# Patient Record
Sex: Male | Born: 1960
Health system: Southern US, Community
[De-identification: ages and names within clinical notes are randomized; demographics above are authoritative.]

## PROBLEM LIST (undated history)

## (undated) DIAGNOSIS — Z86010 Personal history of colonic polyps: Secondary | ICD-10-CM

## (undated) DIAGNOSIS — R011 Cardiac murmur, unspecified: Secondary | ICD-10-CM

## (undated) DIAGNOSIS — J45909 Unspecified asthma, uncomplicated: Secondary | ICD-10-CM

## (undated) DIAGNOSIS — J449 Chronic obstructive pulmonary disease, unspecified: Secondary | ICD-10-CM

## (undated) DIAGNOSIS — C801 Malignant (primary) neoplasm, unspecified: Secondary | ICD-10-CM

## (undated) HISTORY — DX: Chronic obstructive pulmonary disease, unspecified: J44.9

## (undated) HISTORY — PX: MOHS SURGERY: SUR867

## (undated) HISTORY — PX: NOSE SURGERY: SHX723

## (undated) HISTORY — PX: HERNIA REPAIR: SHX51

## (undated) HISTORY — DX: Cardiac murmur, unspecified: R01.1

## (undated) HISTORY — DX: Personal history of colonic polyps: Z86.010

## (undated) HISTORY — DX: Unspecified asthma, uncomplicated: J45.909

## (undated) HISTORY — DX: Malignant (primary) neoplasm, unspecified: C80.1

---

## 2000-08-01 ENCOUNTER — Observation Stay (HOSPITAL_COMMUNITY): Admission: RE | Admit: 2000-08-01 | Discharge: 2000-08-02 | Payer: Self-pay | Admitting: Internal Medicine

## 2000-08-01 ENCOUNTER — Encounter: Payer: Self-pay | Admitting: Internal Medicine

## 2002-02-28 HISTORY — PX: COLONOSCOPY: SHX174

## 2007-07-21 ENCOUNTER — Emergency Department (HOSPITAL_COMMUNITY): Admission: EM | Admit: 2007-07-21 | Discharge: 2007-07-21 | Payer: Self-pay | Admitting: Family Medicine

## 2007-08-29 ENCOUNTER — Ambulatory Visit: Payer: Self-pay | Admitting: Internal Medicine

## 2007-08-29 DIAGNOSIS — S6990XA Unspecified injury of unspecified wrist, hand and finger(s), initial encounter: Secondary | ICD-10-CM | POA: Insufficient documentation

## 2007-08-29 DIAGNOSIS — L255 Unspecified contact dermatitis due to plants, except food: Secondary | ICD-10-CM | POA: Insufficient documentation

## 2007-08-29 DIAGNOSIS — Z87448 Personal history of other diseases of urinary system: Secondary | ICD-10-CM | POA: Insufficient documentation

## 2007-08-29 DIAGNOSIS — F329 Major depressive disorder, single episode, unspecified: Secondary | ICD-10-CM | POA: Insufficient documentation

## 2007-08-29 DIAGNOSIS — F3289 Other specified depressive episodes: Secondary | ICD-10-CM | POA: Insufficient documentation

## 2007-08-29 DIAGNOSIS — S59919A Unspecified injury of unspecified forearm, initial encounter: Secondary | ICD-10-CM

## 2007-08-29 DIAGNOSIS — S59909A Unspecified injury of unspecified elbow, initial encounter: Secondary | ICD-10-CM | POA: Insufficient documentation

## 2007-09-04 ENCOUNTER — Encounter (INDEPENDENT_AMBULATORY_CARE_PROVIDER_SITE_OTHER): Payer: Self-pay | Admitting: *Deleted

## 2007-09-04 LAB — CONVERTED CEMR LAB
ALT: 26 units/L (ref 0–53)
AST: 24 units/L (ref 0–37)
Albumin: 4.3 g/dL (ref 3.5–5.2)
Alkaline Phosphatase: 47 units/L (ref 39–117)
BUN: 11 mg/dL (ref 6–23)
Basophils Absolute: 0 10*3/uL (ref 0.0–0.1)
Basophils Relative: 0.7 % (ref 0.0–1.0)
Bilirubin, Direct: 0.1 mg/dL (ref 0.0–0.3)
CO2: 32 meq/L (ref 19–32)
Calcium: 9.4 mg/dL (ref 8.4–10.5)
Chloride: 102 meq/L (ref 96–112)
Cholesterol: 191 mg/dL (ref 0–200)
Creatinine, Ser: 0.9 mg/dL (ref 0.4–1.5)
Eosinophils Absolute: 0.2 10*3/uL (ref 0.0–0.7)
Eosinophils Relative: 4 % (ref 0.0–5.0)
GFR calc Af Amer: 116 mL/min
GFR calc non Af Amer: 96 mL/min
Glucose, Bld: 94 mg/dL (ref 70–99)
HCT: 45.5 % (ref 39.0–52.0)
HDL: 68.7 mg/dL (ref >39.0)
Hemoglobin: 15.5 g/dL (ref 13.0–17.0)
LDL Cholesterol: 114 mg/dL — ABNORMAL HIGH (ref 0–99)
Lymphocytes Relative: 28.1 % (ref 12.0–46.0)
MCHC: 34.1 g/dL (ref 30.0–36.0)
MCV: 91.3 fL (ref 78.0–100.0)
Monocytes Absolute: 0.3 10*3/uL (ref 0.1–1.0)
Monocytes Relative: 4.5 % (ref 3.0–12.0)
Neutro Abs: 4 10*3/uL (ref 1.4–7.7)
Neutrophils Relative %: 62.7 % (ref 43.0–77.0)
PSA: 0.44 ng/mL (ref 0.10–4.00)
Platelets: 229 10*3/uL (ref 150–400)
Potassium: 4.7 meq/L (ref 3.5–5.1)
RBC: 4.98 M/uL (ref 4.22–5.81)
RDW: 12.3 % (ref 11.5–14.6)
Sodium: 140 meq/L (ref 135–145)
TSH: 1.93 microintl units/mL (ref 0.35–5.50)
Total Bilirubin: 0.7 mg/dL (ref 0.3–1.2)
Total CHOL/HDL Ratio: 2.8
Total Protein: 6.5 g/dL (ref 6.0–8.3)
Triglycerides: 42 mg/dL (ref 0–149)
VLDL: 8 mg/dL (ref 0–40)
WBC: 6.2 10*3/uL (ref 4.5–10.5)

## 2008-01-02 ENCOUNTER — Ambulatory Visit: Payer: Self-pay | Admitting: Family Medicine

## 2010-04-24 ENCOUNTER — Emergency Department (HOSPITAL_COMMUNITY)
Admission: EM | Admit: 2010-04-24 | Discharge: 2010-04-24 | Disposition: A | Discharge status: Home / Self Care | Payer: Self-pay | Attending: Emergency Medicine | Admitting: Emergency Medicine

## 2010-04-24 DIAGNOSIS — S01119A Laceration without foreign body of unspecified eyelid and periocular area, initial encounter: Secondary | ICD-10-CM | POA: Insufficient documentation

## 2010-04-24 DIAGNOSIS — IMO0002 Reserved for concepts with insufficient information to code with codable children: Secondary | ICD-10-CM | POA: Insufficient documentation

## 2010-04-24 DIAGNOSIS — Z79899 Other long term (current) drug therapy: Secondary | ICD-10-CM | POA: Insufficient documentation

## 2010-04-24 DIAGNOSIS — W010XXA Fall on same level from slipping, tripping and stumbling without subsequent striking against object, initial encounter: Secondary | ICD-10-CM | POA: Insufficient documentation

## 2010-04-24 DIAGNOSIS — S0003XA Contusion of scalp, initial encounter: Secondary | ICD-10-CM | POA: Insufficient documentation

## 2010-04-24 DIAGNOSIS — F341 Dysthymic disorder: Secondary | ICD-10-CM | POA: Insufficient documentation

## 2010-04-24 DIAGNOSIS — Y990 Civilian activity done for income or pay: Secondary | ICD-10-CM | POA: Insufficient documentation

## 2010-04-24 DIAGNOSIS — H5710 Ocular pain, unspecified eye: Secondary | ICD-10-CM | POA: Insufficient documentation

## 2010-07-16 NOTE — H&P (Signed)
[facility]  Patient:    Roberto Jenkins, Roberto Jenkins                          MRN: [id number] Adm. Date:  08/01/00 Attending:  Claretta Fraise, M.D.                         History and Physical  NOTE:  Medical record number from the clinic is [phone].  Patient is being admitted from the [facility] primary care clinic.  HISTORY OF PRESENT ILLNESS:  This is a 50 year old male patient of Dr. Alwyn Ren, who is here with a chief complaint of chest tightness x 2 days.  Patient notes that his chest tightness happened while he was at work, both on exertion and at rest.  Patient is not a very good historian and is quite vague in trying to get some of his history out of him.  In any case, he does report the tightness feeling.  He does not have any associated symptoms of shortness of breath or diaphoresis, but he does note that the chest tightness has been on and off over the past two days.  He does report an increased amount of stress, primarily at work and both at home, and he does note that a lot of his chest tightness may worsen as he is driving home.  This has been the case over the past two weeks that he apparently has been having this chest tightness on and off.  He says that relaxing and not having any other stimulation makes him feel better.  Patient has had a mild cough but has not heard himself wheezing. There is no previous history of asthma.  Some of his cough has been mostly dry.  He does have some nasal stuffiness and for the past few days, he has been using his steroid nasal spray and also an antihistamine nasal spray.  In regard to his other associated symptoms, he denies any paroxysmal nocturnal dyspnea, orthopnea, or lower extremity edema.  In regard to his cardiac risk factors, he is a smoker of about a pack per day for the past 20 years.  He does not know his family history, since he is an adopted child.  There is no known history of  hypertension or diabetes.  His last cholesterol check was done approximately four years ago, and he says it was apparently normal and has always been normal.  He is not obese.  He is fairly slender, actually.  ALLERGIES:  No known drug allergies.  MEDICATIONS:  He is on the Nasonex and also the Afrin nasal spray.  He has not been taking any decongestants.  PAST MEDICAL HISTORY:  Significant only for allergic rhinitis.  PAST SURGICAL HISTORY:  He has had a nose fracture repair done when he was a child and also bilateral inguinal herniorrhaphies as a child.  FAMILY HISTORY:  Noncontributory, since he is adopted.  SOCIAL HISTORY:  He is married.  He does smoke.  REVIEW OF SYSTEMS:  As per HPI.  He otherwise denies any melena, hematochezia. No urinary symptoms.  He does report heartburn over the past month that was initially q.d. but since the use of over-the-counter Zantac, it has lessened to where he may only get it episodically.  PHYSICAL EXAMINATION:  VITAL SIGNS:  His blood pressure is 108/78, respiratory rate 18, pulse of 61. He is afebrile.  Weight of 169 pounds.  GENERAL:  He is in no apparent distress.  He is a fairly lean gentleman.  HEENT:  Fairly unremarkable.  His TMs are unremarkable.  Oropharynx is without any erythema or exudate, but he does have some postnasal drainage.  Nose: Normal mucosa.  NECK:  No adenopathy.  Carotid pulses are 2+ bilaterally.  CHEST:  Lungs are clear to auscultation bilaterally without any crackles or wheezes.  CARDIAC:  Regular rate and rhythm.  There is no murmur auscultated.  His radial pulses are 2+, and he has no pretibial or presacral edema.  ABDOMEN:  Bowel sounds are normal.  Soft, nontender.  No organomegaly.  No masses are palpated.  NEUROLOGIC:  Grossly normal.  LABORATORY DATA:  A 12-lead EKG was done, which showed normal sinus rhythm. He had some early repolarization signs in V2 and V3 but otherwise no ST-T  wave changes or elevations.  There is no suggestion of old MI on the EKG, either.  ASSESSMENT AND PLAN: 1. Chest pain.  The nature of the chest pain seems atypical, but the pattern    of the chest pain and the lack of associated symptoms goes against cardiac    in source, and also the fact that it has been constant on and off kind of    goes against cardiac also.  There is a strong suggestion of anxiety causing    a major portion of his symptoms.  However, in light of him being a male in    his age group and him being a smoker, will go ahead and admit him for rule    out myocardial infarction with serial CKs and troponin.  Will just do    sublingual nitroglycerin.  The beta blocker is being held currently, since    his heart rate is already 61.  Will go ahead and give him aspirin once a    day and set him up for lipid profile in the morning. 2. In regard to his anxiety, which I believe is a major cause of his chest    tightness, will go ahead and start him on Prozac 10 mg p.o. q.d. x 1 week,    and then he will be increased to 20 mg p.o. q.d. 3. In regard to some of his heartburn symptoms, will go ahead and do the    Protonix 40 mg p.o. q.d. DD:  08/01/00 TD:  08/02/00 Job: [id number]

## 2010-07-16 NOTE — Discharge Summary (Signed)
[facility]  Patient:    Roberto Jenkins, Roberto Jenkins                        MRN: [id number] Adm. Date:  [id number] Disc. Date: [id number] Attending:  Dorena Cookey CC:         Titus Dubin. Alwyn Ren, M.D. [facility]   Discharge Summary  ADMISSION DIAGNOSIS:  Chest pain.  DISCHARGE DIAGNOSIS:  Chest pain, myocardial infarction ruled out.  HISTORY OF PRESENT ILLNESS:  The patient is a 50 year old Caucasian gentleman who presented to the [facility] office and was seen by Dr. Baldo Daub on the day of admission.  He had had a two-month history of chest pain.  This was atypical discomfort with no orthopnea, no diaphoresis, nonexertional.  The patient has a relatively low cardiac risk profile with positive ______ smoking history.  He has been under a significant amount of stress.  Please see Dr. Yetta Numbers note for past medical history, family history, and exam.  HOSPITAL COURSE:  The patient was admitted to a telemetry unit.  Serial cardiac enzymes were obtained, which returned as CK of 45 with troponin of 0.01, CK of 49, no MB fraction.  Troponin is 0.01.  On telemetry he had a stable rhythm.  Additional laboratories reveal a normal CBC and normal basic metabolic panel.  Chest x-ray revealed no active disease except for possible early bronchitis.  With the patients cardiac enzymes being stable, with him having no significant ______ chest pain, he is felt to be stable and ready for discharge.  DISPOSITION:  The patient is discharged home.  DISCHARGE INSTRUCTIONS:  He will need to be scheduled for a stress Cardiolite study, which can be arranged through Dr. Caryl Never office.  I have recommended that he talk with Dr. Alwyn Ren about arranging for some treatments of stress management.  The patient has already been started on Prozac, which he will continue.  The patient does have a history of reflux and will continue on Protonix.  The patient is advised to stop  smoking.  CONDITION ON DISCHARGE:  Stable, with MI ruled out. DD:  08/02/00 TD:  08/02/00 Job: [id number]

## 2011-09-21 ENCOUNTER — Encounter (HOSPITAL_COMMUNITY): Payer: Self-pay

## 2011-09-21 ENCOUNTER — Emergency Department (INDEPENDENT_AMBULATORY_CARE_PROVIDER_SITE_OTHER)
Admission: EM | Admit: 2011-09-21 | Discharge: 2011-09-21 | Disposition: A | Discharge status: Home / Self Care | Payer: Self-pay | Source: Home / Self Care

## 2011-09-21 DIAGNOSIS — J029 Acute pharyngitis, unspecified: Secondary | ICD-10-CM

## 2011-09-21 DIAGNOSIS — R059 Cough, unspecified: Secondary | ICD-10-CM

## 2011-09-21 DIAGNOSIS — R05 Cough: Secondary | ICD-10-CM

## 2011-09-21 DIAGNOSIS — J3489 Other specified disorders of nose and nasal sinuses: Secondary | ICD-10-CM

## 2011-09-21 MED ORDER — LORATADINE 10 MG PO TABS: 10.0000 mg | ORAL_TABLET | Freq: Every day | ORAL | Status: DC

## 2011-09-21 MED ORDER — ALBUTEROL SULFATE HFA 108 (90 BASE) MCG/ACT IN AERS: 2.0000 | INHALATION_SPRAY | Freq: Four times a day (QID) | RESPIRATORY_TRACT | Status: DC | PRN

## 2011-09-21 NOTE — ED Provider Notes (Signed)
History     CSN: [id number]  Arrival date & time 09/21/11  1521   None     Chief Complaint  Patient presents with  . Sore Throat    (Consider location/radiation/quality/duration/timing/severity/associated sxs/prior treatment) Patient is a 51 y.o. male presenting with pharyngitis. The history is provided by the patient.  Sore Throat  Roberto Jenkins is a 51 y.o. male who complains of onset of sore throat for 10 days.  Scratchy sore throat + cough, non productive No pleuritic pain No wheezing + nasal congestion + post-nasal drainage No sinus pain/pressure No laryngitis No chest congestion No itchy/red eyes No earache No hemoptysis No SOB No chills/sweats No fever No nausea No vomiting No abdominal pain No diarrhea No skin rashes No fatigue + myalgias No headache  No ill contacts   History reviewed. No pertinent past medical history.  Past Surgical History  Procedure Date  . Hernia repair     No family history on file.  History  Substance Use Topics  . Smoking status: Current Everyday Smoker -- 1.0 packs/day  . Smokeless tobacco: Not on file  . Alcohol Use: Yes      Review of Systems  All other systems reviewed and are negative.    Allergies  Review of patient's allergies indicates no known allergies.  Home Medications   Current Outpatient Rx  Name Route Sig Dispense Refill  . ALBUTEROL SULFATE HFA 108 (90 BASE) MCG/ACT IN AERS Inhalation Inhale 2 puffs into the lungs every 6 (six) hours as needed for wheezing or shortness of breath. 1 Inhaler 2  . LORATADINE 10 MG PO TABS Oral Take 1 tablet (10 mg total) by mouth daily. 30 tablet 0    BP 138/88  Pulse 85  Temp 98.6 F (37 C) (Oral)  Resp 16  SpO2 98%  Physical Exam  Nursing note and vitals reviewed. Constitutional: He is oriented to person, place, and time. Vital signs are normal. He appears well-developed and well-nourished. He is active and cooperative.  HENT:  Head: Normocephalic.    Right Ear: Hearing, tympanic membrane, external ear and ear canal normal.  Left Ear: Hearing, tympanic membrane, external ear and ear canal normal.  Nose: Rhinorrhea present. No mucosal edema.  Mouth/Throat: Uvula is midline, oropharynx is clear and moist and mucous membranes are normal.       Postnasal drip noted  Eyes: Conjunctivae are normal. Pupils are equal, round, and reactive to light. No scleral icterus.  Neck: Trachea normal. Neck supple.  Cardiovascular: Normal rate and regular rhythm.   Pulmonary/Chest: Effort normal and breath sounds normal.  Lymphadenopathy:       Head (right side): No submental, no submandibular, no tonsillar, no preauricular, no posterior auricular and no occipital adenopathy present.       Head (left side): No submental, no submandibular, no tonsillar, no preauricular, no posterior auricular and no occipital adenopathy present.    He has no cervical adenopathy.  Neurological: He is alert and oriented to person, place, and time. No cranial nerve deficit or sensory deficit.  Skin: Skin is warm and dry.  Psychiatric: He has a normal mood and affect. His speech is normal and behavior is normal. Judgment and thought content normal. Cognition and memory are normal.    ED Course  Procedures (including critical care time)   Labs Reviewed  POCT RAPID STREP A ([facility] CARE ONLY)   No results found.   1. Pharyngitis   2. Rhinorrhea   3. Cough  MDM  Increase fluid intake, rest.  No antibiotics indicated.  Begin expectorant/decongestant, topical decongestant, saline nasal spray and/or saline irrigation, and cough suppressant at bedtime. Antihistamines of your choice (Claritin or Zyrtec).  Tylenol or Motrin for fever/discomfort.  Followup with PCP if not improving 7 to 10 days.        Roberto Kindred, NP 09/21/11 2112

## 2011-09-21 NOTE — ED Notes (Signed)
C/o sore throat, stuffy nose, thick clear nasal secretions, fatigue and increased coughing.  Sx for 10 days.  States he feels like he has had fever.

## 2011-09-22 NOTE — ED Provider Notes (Signed)
Medical screening examination/treatment/procedure(s) were performed by non-physician practitioner and as supervising physician I was immediately available for consultation/collaboration.  Leslee Home, M.D.   Reuben Likes, MD 09/22/11 1052

## 2013-02-28 NOTE — ED Notes (Signed)
Pt  called  Asking  For  A refill of  His  Albuterol     Message  Left on his  Answering  Machine

## 2014-09-26 ENCOUNTER — Encounter: Payer: Self-pay | Admitting: Family

## 2014-09-26 ENCOUNTER — Other Ambulatory Visit (INDEPENDENT_AMBULATORY_CARE_PROVIDER_SITE_OTHER): Payer: 59

## 2014-09-26 ENCOUNTER — Ambulatory Visit (INDEPENDENT_AMBULATORY_CARE_PROVIDER_SITE_OTHER): Payer: 59 | Admitting: Family

## 2014-09-26 VITALS — BP 144/100 | HR 74 | Temp 98.2°F | Resp 18 | Ht 71.0 in | Wt 161.4 lb

## 2014-09-26 DIAGNOSIS — Z0000 Encounter for general adult medical examination without abnormal findings: Secondary | ICD-10-CM

## 2014-09-26 DIAGNOSIS — Z9189 Other specified personal risk factors, not elsewhere classified: Secondary | ICD-10-CM

## 2014-09-26 DIAGNOSIS — M545 Low back pain, unspecified: Secondary | ICD-10-CM

## 2014-09-26 DIAGNOSIS — K649 Unspecified hemorrhoids: Secondary | ICD-10-CM | POA: Diagnosis not present

## 2014-09-26 DIAGNOSIS — R21 Rash and other nonspecific skin eruption: Secondary | ICD-10-CM

## 2014-09-26 LAB — BASIC METABOLIC PANEL
BUN: 9 mg/dL (ref 6–23)
CO2: 30 mEq/L (ref 19–32)
Calcium: 9.6 mg/dL (ref 8.4–10.5)
Chloride: 100 mEq/L (ref 96–112)
Creatinine, Ser: 0.78 mg/dL (ref 0.40–1.50)
GFR: 110.02 mL/min (ref >60.00)
Glucose, Bld: 77 mg/dL (ref 70–99)
Potassium: 4.3 mEq/L (ref 3.5–5.1)
Sodium: 139 mEq/L (ref 135–145)

## 2014-09-26 LAB — CBC
HCT: 46.3 % (ref 39.0–52.0)
Hemoglobin: 15.6 g/dL (ref 13.0–17.0)
MCHC: 33.6 g/dL (ref 30.0–36.0)
MCV: 90 fl (ref 78.0–100.0)
Platelets: 218 10*3/uL (ref 150.0–400.0)
RBC: 5.15 Mil/uL (ref 4.22–5.81)
RDW: 13 % (ref 11.5–15.5)
WBC: 6.2 10*3/uL (ref 4.0–10.5)

## 2014-09-26 LAB — LIPID PANEL
CHOL/HDL RATIO: 2
CHOLESTEROL: 214 mg/dL — AB (ref 0–200)
HDL: 100.5 mg/dL (ref >39.00)
LDL Cholesterol: 101 mg/dL — ABNORMAL HIGH (ref 0–99)
NonHDL: 113.62
Triglycerides: 64 mg/dL (ref 0.0–149.0)
VLDL: 12.8 mg/dL (ref 0.0–40.0)

## 2014-09-26 LAB — TSH: TSH: 2.08 u[IU]/mL (ref 0.35–4.50)

## 2014-09-26 LAB — PSA: PSA: 0.16 ng/mL (ref 0.10–4.00)

## 2014-09-26 NOTE — Patient Instructions (Addendum)
Thank you for choosing [facility].  Summary/Instructions:   Please stop by the lab on the basement level of the building for your blood work. Your results will be released to [place] (or called to you) after review, usually within 72 hours after test completion. If any changes need to be made, you will be notified at that same time.  If your symptoms worsen or fail to improve, please contact our office for further instruction, or in case of emergency go directly to the emergency room at the closest medical facility.    Hemorrhoids Hemorrhoids are swollen veins around the rectum or anus. There are two types of hemorrhoids:   Internal hemorrhoids. These occur in the veins just inside the rectum. They may poke through to the outside and become irritated and painful.  External hemorrhoids. These occur in the veins outside the anus and can be felt as a painful swelling or hard lump near the anus. CAUSES  Pregnancy.   Obesity.   Constipation or diarrhea.   Straining to have a bowel movement.   Sitting for long periods on the toilet.  Heavy lifting or other activity that caused you to strain.  Anal intercourse. SYMPTOMS   Pain.   Anal itching or irritation.   Rectal bleeding.   Fecal leakage.   Anal swelling.   One or more lumps around the anus.  DIAGNOSIS  Your caregiver may be able to diagnose hemorrhoids by visual examination. Other examinations or tests that may be performed include:   Examination of the rectal area with a gloved hand (digital rectal exam).   Examination of anal canal using a small tube (scope).   A blood test if you have lost a significant amount of blood.  A test to look inside the colon (sigmoidoscopy or colonoscopy). TREATMENT Most hemorrhoids can be treated at home. However, if symptoms do not seem to be getting better or if you have a lot of rectal bleeding, your caregiver may perform a procedure to help make the  hemorrhoids get smaller or remove them completely. Possible treatments include:   Placing a rubber band at the base of the hemorrhoid to cut off the circulation (rubber band ligation).   Injecting a chemical to shrink the hemorrhoid (sclerotherapy).   Using a tool to burn the hemorrhoid (infrared light therapy).   Surgically removing the hemorrhoid (hemorrhoidectomy).   Stapling the hemorrhoid to block blood flow to the tissue (hemorrhoid stapling).  HOME CARE INSTRUCTIONS   Eat foods with fiber, such as whole grains, beans, nuts, fruits, and vegetables. Ask your doctor about taking products with added fiber in them (fibersupplements).  Increase fluid intake. Drink enough water and fluids to keep your urine clear or pale yellow.   Exercise regularly.   Go to the bathroom when you have the urge to have a bowel movement. Do not wait.   Avoid straining to have bowel movements.   Keep the anal area dry and clean. Use wet toilet paper or moist towelettes after a bowel movement.   Medicated creams and suppositories may be used or applied as directed.   Only take over-the-counter or prescription medicines as directed by your caregiver.   Take warm sitz baths for 15-20 minutes, 3-4 times a day to ease pain and discomfort.   Place ice packs on the hemorrhoids if they are tender and swollen. Using ice packs between sitz baths may be helpful.   Put ice in a plastic bag.   Place a towel between your  skin and the bag.   Leave the ice on for 15-20 minutes, 3-4 times a day.   Do not use a donut-shaped pillow or sit on the toilet for long periods. This increases blood pooling and pain.  SEEK MEDICAL CARE IF:  You have increasing pain and swelling that is not controlled by treatment or medicine.  You have uncontrolled bleeding.  You have difficulty or you are unable to have a bowel movement.  You have pain or inflammation outside the area of the hemorrhoids. MAKE  SURE YOU:  Understand these instructions.  Will watch your condition.  Will get help right away if you are not doing well or get worse. Document Released: 02/12/2000 Document Revised: 02/01/2012 Document Reviewed: 12/20/2011 [facility] Patient Information 2015 [place], [place]. This information is not intended to replace advice given to you by your health care provider. Make sure you discuss any questions you have with your health care provider.  Low Back Sprain with Rehab  A sprain is an injury in which a ligament is torn. The ligaments of the lower back are vulnerable to sprains. However, they are strong and require great force to be injured. These ligaments are important for stabilizing the spinal column. Sprains are classified into three categories. Grade 1 sprains cause pain, but the tendon is not lengthened. Grade 2 sprains include a lengthened ligament, due to the ligament being stretched or partially ruptured. With grade 2 sprains there is still function, although the function may be decreased. Grade 3 sprains involve a complete tear of the tendon or muscle, and function is usually impaired. SYMPTOMS   Severe pain in the lower back.  Sometimes, a feeling of a "pop," "snap," or tear, at the time of injury.  Tenderness and sometimes swelling at the injury site.  Uncommonly, bruising (contusion) within 48 hours of injury.  Muscle spasms in the back. CAUSES  Low back sprains occur when a force is placed on the ligaments that is greater than they can handle. Common causes of injury include:  Performing a stressful act while off-balance.  Repetitive stressful activities that involve movement of the lower back.  Direct hit (trauma) to the lower back. RISK INCREASES WITH:  Contact sports (football, wrestling).  Collisions (major skiing accidents).  Sports that require throwing or lifting (baseball, weightlifting).  Sports involving twisting of the spine (gymnastics, diving,  tennis, golf).  Poor strength and flexibility.  Inadequate protection.  Previous back injury or surgery (especially fusion). PREVENTION  Wear properly fitted and padded protective equipment.  Warm up and stretch properly before activity.  Allow for adequate recovery between workouts.  Maintain physical fitness:  Strength, flexibility, and endurance.  Cardiovascular fitness.  Maintain a healthy body weight. PROGNOSIS  If treated properly, low back sprains usually heal with non-surgical treatment. The length of time for healing depends on the severity of the injury.  RELATED COMPLICATIONS   Recurring symptoms, resulting in a chronic problem.  Chronic inflammation and pain in the low back.  Delayed healing or resolution of symptoms, especially if activity is resumed too soon.  Prolonged impairment.  Unstable or arthritic joints of the low back. TREATMENT  Treatment first involves the use of ice and medicine, to reduce pain and inflammation. The use of strengthening and stretching exercises may help reduce pain with activity. These exercises may be performed at home or with a therapist. Severe injuries may require referral to a therapist for further evaluation and treatment, such as ultrasound. Your caregiver may advise that you  wear a back brace or corset, to help reduce pain and discomfort. Often, prolonged bed rest results in greater harm then benefit. Corticosteroid injections may be recommended. However, these should be reserved for the most serious cases. It is important to avoid using your back when lifting objects. At night, sleep on your back on a firm mattress, with a pillow placed under your knees. If non-surgical treatment is unsuccessful, surgery may be needed.  MEDICATION   If pain medicine is needed, nonsteroidal anti-inflammatory medicines (aspirin and ibuprofen), or other minor pain relievers (acetaminophen), are often advised.  Do not take pain medicine for 7  days before surgery.  Prescription pain relievers may be given, if your caregiver thinks they are needed. Use only as directed and only as much as you need.  Ointments applied to the skin may be helpful.  Corticosteroid injections may be given by your caregiver. These injections should be reserved for the most serious cases, because they may only be given a certain number of times. HEAT AND COLD  Cold treatment (icing) should be applied for 10 to 15 minutes every 2 to 3 hours for inflammation and pain, and immediately after activity that aggravates your symptoms. Use ice packs or an ice massage.  Heat treatment may be used before performing stretching and strengthening activities prescribed by your caregiver, physical therapist, or athletic trainer. Use a heat pack or a warm water soak. SEEK MEDICAL CARE IF:   Symptoms get worse or do not improve in 2 to 4 weeks, despite treatment.  You develop numbness or weakness in either leg.  You lose bowel or bladder function.  Any of the following occur after surgery: fever, increased pain, swelling, redness, drainage of fluids, or bleeding in the affected area.  New, unexplained symptoms develop. (Drugs used in treatment may produce side effects.) EXERCISES  RANGE OF MOTION (ROM) AND STRETCHING EXERCISES - Low Back Sprain Most people with lower back pain will find that their symptoms get worse with excessive bending forward (flexion) or arching at the lower back (extension). The exercises that will help resolve your symptoms will focus on the opposite motion.  Your physician, physical therapist or athletic trainer will help you determine which exercises will be most helpful to resolve your lower back pain. Do not complete any exercises without first consulting with your caregiver. Discontinue any exercises which make your symptoms worse, until you speak to your caregiver. If you have pain, numbness or tingling which travels down into your buttocks,  leg or foot, the goal of the therapy is for these symptoms to move closer to your back and eventually resolve. Sometimes, these leg symptoms will get better, but your lower back pain may worsen. This is often an indication of progress in your rehabilitation. Be very alert to any changes in your symptoms and the activities in which you participated in the 24 hours prior to the change. Sharing this information with your caregiver will allow him or her to most efficiently treat your condition. These exercises may help you when beginning to rehabilitate your injury. Your symptoms may resolve with or without further involvement from your physician, physical therapist or athletic trainer. While completing these exercises, remember:   Restoring tissue flexibility helps normal motion to return to the joints. This allows healthier, less painful movement and activity.  An effective stretch should be held for at least 30 seconds.  A stretch should never be painful. You should only feel a gentle lengthening or release in the stretched  tissue. FLEXION RANGE OF MOTION AND STRETCHING EXERCISES: STRETCH - Flexion, Single Knee to Chest   Lie on a firm bed or floor with both legs extended in front of you.  Keeping one leg in contact with the floor, bring your opposite knee to your chest. Hold your leg in place by either grabbing behind your thigh or at your knee.  Pull until you feel a gentle stretch in your low back. Hold __________ seconds.  Slowly release your grasp and repeat the exercise with the opposite side. Repeat __________ times. Complete this exercise __________ times per day.  STRETCH - Flexion, Double Knee to Chest  Lie on a firm bed or floor with both legs extended in front of you.  Keeping one leg in contact with the floor, bring your opposite knee to your chest.  Tense your stomach muscles to support your back and then lift your other knee to your chest. Hold your legs in place by either  grabbing behind your thighs or at your knees.  Pull both knees toward your chest until you feel a gentle stretch in your low back. Hold __________ seconds.  Tense your stomach muscles and slowly return one leg at a time to the floor. Repeat __________ times. Complete this exercise __________ times per day.  STRETCH - Low Trunk Rotation  Lie on a firm bed or floor. Keeping your legs in front of you, bend your knees so they are both pointed toward the ceiling and your feet are flat on the floor.  Extend your arms out to the side. This will stabilize your upper body by keeping your shoulders in contact with the floor.  Gently and slowly drop both knees together to one side until you feel a gentle stretch in your low back. Hold for __________ seconds.  Tense your stomach muscles to support your lower back as you bring your knees back to the starting position. Repeat the exercise to the other side. Repeat __________ times. Complete this exercise __________ times per day  EXTENSION RANGE OF MOTION AND FLEXIBILITY EXERCISES: STRETCH - Extension, Prone on Elbows   Lie on your stomach on the floor, a bed will be too soft. Place your palms about shoulder width apart and at the height of your head.  Place your elbows under your shoulders. If this is too painful, stack pillows under your chest.  Allow your body to relax so that your hips drop lower and make contact more completely with the floor.  Hold this position for __________ seconds.  Slowly return to lying flat on the floor. Repeat __________ times. Complete this exercise __________ times per day.  RANGE OF MOTION - Extension, Prone Press Ups  Lie on your stomach on the floor, a bed will be too soft. Place your palms about shoulder width apart and at the height of your head.  Keeping your back as relaxed as possible, slowly straighten your elbows while keeping your hips on the floor. You may adjust the placement of your hands to maximize  your comfort. As you gain motion, your hands will come more underneath your shoulders.  Hold this position __________ seconds.  Slowly return to lying flat on the floor. Repeat __________ times. Complete this exercise __________ times per day.  RANGE OF MOTION- Quadruped, Neutral Spine   Assume a hands and knees position on a firm surface. Keep your hands under your shoulders and your knees under your hips. You may place padding under your knees for comfort.  Drop  your head and point your tailbone toward the ground below you. This will round out your lower back like an angry cat. Hold this position for __________ seconds.  Slowly lift your head and release your tail bone so that your back sags into a large arch, like an old horse.  Hold this position for __________ seconds.  Repeat this until you feel limber in your low back.  Now, find your "sweet spot." This will be the most comfortable position somewhere between the two previous positions. This is your neutral spine. Once you have found this position, tense your stomach muscles to support your low back.  Hold this position for __________ seconds. Repeat __________ times. Complete this exercise __________ times per day.  STRENGTHENING EXERCISES - Low Back Sprain These exercises may help you when beginning to rehabilitate your injury. These exercises should be done near your "sweet spot." This is the neutral, low-back arch, somewhere between fully rounded and fully arched, that is your least painful position. When performed in this safe range of motion, these exercises can be used for people who have either a flexion or extension based injury. These exercises may resolve your symptoms with or without further involvement from your physician, physical therapist or athletic trainer. While completing these exercises, remember:   Muscles can gain both the endurance and the strength needed for everyday activities through controlled  exercises.  Complete these exercises as instructed by your physician, physical therapist or athletic trainer. Increase the resistance and repetitions only as guided.  You may experience muscle soreness or fatigue, but the pain or discomfort you are trying to eliminate should never worsen during these exercises. If this pain does worsen, stop and make certain you are following the directions exactly. If the pain is still present after adjustments, discontinue the exercise until you can discuss the trouble with your caregiver. STRENGTHENING - Deep Abdominals, Pelvic Tilt   Lie on a firm bed or floor. Keeping your legs in front of you, bend your knees so they are both pointed toward the ceiling and your feet are flat on the floor.  Tense your lower abdominal muscles to press your low back into the floor. This motion will rotate your pelvis so that your tail bone is scooping upwards rather than pointing at your feet or into the floor. With a gentle tension and even breathing, hold this position for __________ seconds. Repeat __________ times. Complete this exercise __________ times per day.  STRENGTHENING - Abdominals, Crunches   Lie on a firm bed or floor. Keeping your legs in front of you, bend your knees so they are both pointed toward the ceiling and your feet are flat on the floor. Cross your arms over your chest.  Slightly tip your chin down without bending your neck.  Tense your abdominals and slowly lift your trunk high enough to just clear your shoulder blades. Lifting higher can put excessive stress on the lower back and does not further strengthen your abdominal muscles.  Control your return to the starting position. Repeat __________ times. Complete this exercise __________ times per day.  STRENGTHENING - Quadruped, Opposite UE/LE Lift   Assume a hands and knees position on a firm surface. Keep your hands under your shoulders and your knees under your hips. You may place padding under  your knees for comfort.  Find your neutral spine and gently tense your abdominal muscles so that you can maintain this position. Your shoulders and hips should form a rectangle that is parallel  with the floor and is not twisted.  Keeping your trunk steady, lift your right hand no higher than your shoulder and then your left leg no higher than your hip. Make sure you are not holding your breath. Hold this position for __________ seconds.  Continuing to keep your abdominal muscles tense and your back steady, slowly return to your starting position. Repeat with the opposite arm and leg. Repeat __________ times. Complete this exercise __________ times per day.  STRENGTHENING - Abdominals and Quadriceps, Straight Leg Raise   Lie on a firm bed or floor with both legs extended in front of you.  Keeping one leg in contact with the floor, bend the other knee so that your foot can rest flat on the floor.  Find your neutral spine, and tense your abdominal muscles to maintain your spinal position throughout the exercise.  Slowly lift your straight leg off the floor about 6 inches for a count of 15, making sure to not hold your breath.  Still keeping your neutral spine, slowly lower your leg all the way to the floor. Repeat this exercise with each leg __________ times. Complete this exercise __________ times per day. POSTURE AND BODY MECHANICS CONSIDERATIONS - Low Back Sprain Keeping correct posture when sitting, standing or completing your activities will reduce the stress put on different body tissues, allowing injured tissues a chance to heal and limiting painful experiences. The following are general guidelines for improved posture. Your physician or physical therapist will provide you with any instructions specific to your needs. While reading these guidelines, remember:  The exercises prescribed by your provider will help you have the flexibility and strength to maintain correct postures.  The  correct posture provides the best environment for your joints to work. All of your joints have less wear and tear when properly supported by a spine with good posture. This means you will experience a healthier, less painful body.  Correct posture must be practiced with all of your activities, especially prolonged sitting and standing. Correct posture is as important when doing repetitive low-stress activities (typing) as it is when doing a single heavy-load activity (lifting). RESTING POSITIONS Consider which positions are most painful for you when choosing a resting position. If you have pain with flexion-based activities (sitting, bending, stooping, squatting), choose a position that allows you to rest in a less flexed posture. You would want to avoid curling into a fetal position on your side. If your pain worsens with extension-based activities (prolonged standing, working overhead), avoid resting in an extended position such as sleeping on your stomach. Most people will find more comfort when they rest with their spine in a more neutral position, neither too rounded nor too arched. Lying on a non-sagging bed on your side with a pillow between your knees, or on your back with a pillow under your knees will often provide some relief. Keep in mind, being in any one position for a prolonged period of time, no matter how correct your posture, can still lead to stiffness. PROPER SITTING POSTURE In order to minimize stress and discomfort on your spine, you must sit with correct posture. Sitting with good posture should be effortless for a healthy body. Returning to good posture is a gradual process. Many people can work toward this most comfortably by using various supports until they have the flexibility and strength to maintain this posture on their own. When sitting with proper posture, your ears will fall over your shoulders and your shoulders will fall  over your hips. You should use the back of the chair  to support your upper back. Your lower back will be in a neutral position, just slightly arched. You may place a small pillow or folded towel at the base of your lower back for  support.  When working at a desk, create an environment that supports good, upright posture. Without extra support, muscles tire, which leads to excessive strain on joints and other tissues. Keep these recommendations in mind: CHAIR:  A chair should be able to slide under your desk when your back makes contact with the back of the chair. This allows you to work closely.  The chair's height should allow your eyes to be level with the upper part of your monitor and your hands to be slightly lower than your elbows. BODY POSITION  Your feet should make contact with the floor. If this is not possible, use a foot rest.  Keep your ears over your shoulders. This will reduce stress on your neck and low back. INCORRECT SITTING POSTURES  If you are feeling tired and unable to assume a healthy sitting posture, do not slouch or slump. This puts excessive strain on your back tissues, causing more damage and pain. Healthier options include:  Using more support, like a lumbar pillow.  Switching tasks to something that requires you to be upright or walking.  Talking a brief walk.  Lying down to rest in a neutral-spine position. PROLONGED STANDING WHILE SLIGHTLY LEANING FORWARD  When completing a task that requires you to lean forward while standing in one place for a long time, place either foot up on a stationary 2-4 inch high object to help maintain the best posture. When both feet are on the ground, the lower back tends to lose its slight inward curve. If this curve flattens (or becomes too large), then the back and your other joints will experience too much stress, tire more quickly, and can cause pain. CORRECT STANDING POSTURES Proper standing posture should be assumed with all daily activities, even if they only take a few  moments, like when brushing your teeth. As in sitting, your ears should fall over your shoulders and your shoulders should fall over your hips. You should keep a slight tension in your abdominal muscles to brace your spine. Your tailbone should point down to the ground, not behind your body, resulting in an over-extended swayback posture.  INCORRECT STANDING POSTURES  Common incorrect standing postures include a forward head, locked knees and/or an excessive swayback. WALKING Walk with an upright posture. Your ears, shoulders and hips should all line-up. PROLONGED ACTIVITY IN A FLEXED POSITION When completing a task that requires you to bend forward at your waist or lean over a low surface, try to find a way to stabilize 3 out of 4 of your limbs. You can place a hand or elbow on your thigh or rest a knee on the surface you are reaching across. This will provide you more stability, so that your muscles do not tire as quickly. By keeping your knees relaxed, or slightly bent, you will also reduce stress across your lower back. CORRECT LIFTING TECHNIQUES DO :  Assume a wide stance. This will provide you more stability and the opportunity to get as close as possible to the object which you are lifting.  Tense your abdominals to brace your spine. Bend at the knees and hips. Keeping your back locked in a neutral-spine position, lift using your leg muscles. Lift with your  legs, keeping your back straight.  Test the weight of unknown objects before attempting to lift them.  Try to keep your elbows locked down at your sides in order get the best strength from your shoulders when carrying an object.  Always ask for help when lifting heavy or awkward objects. INCORRECT LIFTING TECHNIQUES DO NOT:   Lock your knees when lifting, even if it is a small object.  Bend and twist. Pivot at your feet or move your feet when needing to change directions.  Assume that you can safely pick up even a paperclip  without proper posture. Document Released: 02/14/2005 Document Revised: 05/09/2011 Document Reviewed: 05/29/2008 [facility] Patient Information 2015 [place], [place]. This information is not intended to replace advice given to you by your health care provider. Make sure you discuss any questions you have with your health care provider.

## 2014-09-26 NOTE — Progress Notes (Signed)
Subjective:    Patient ID: Roberto Jenkins, male    DOB: June 19, 1960, 54 y.o.   MRN: [id number]  Chief Complaint  Patient presents with  . Establish Care    States he has been passing blood in stool, x3 weeks off and on, has had occasional abdominal pain    HPI:  Roberto Jenkins is a 54 y.o. male with a PMH of asthma and heart murmur who presents today for an office visit to establish care.     1.) Blood in stool - Associated symptom of blood in his stool has been going on for about 3 weeks. Describes the blood as bright red. Blood is only noticed when he has a bowel movement. Denies burning or itching. Occasionally constipated and hard stools with some straining. Denies any modifying factors that make it better or worse. Indicates that he eats about 1 meal per day on average.  2.) Back pain - Associated symptom of pain located in his lumbar spine and radiating out to the side bilaterally. Describes the pain as dull and achy but occasionally sharp. Severity of the pain is about 2-3/10. Denies any modifying factors including OTC medications or home therapy. Denies radiculopathy. Denies changes to bowel or bladder habits or saddle anesthesia.  3.) Mole on back - associated symptom of a mole located on his upper back around his left scapula has been there for several months. Describes it as brown and slightly elevated with no pain. Unsure of a growth or change in size. Denies any modifying factors that make it better or worse.    No Known Allergies   Outpatient Prescriptions Prior to Visit  Medication Sig Dispense Refill  . albuterol (PROVENTIL HFA;VENTOLIN HFA) 108 (90 BASE) MCG/ACT inhaler Inhale 2 puffs into the lungs every 6 (six) hours as needed for wheezing or shortness of breath. 1 Inhaler 2  . loratadine (CLARITIN) 10 MG tablet Take 1 tablet (10 mg total) by mouth daily. 30 tablet 0   No facility-administered medications prior to visit.     Past Medical History  Diagnosis Date    . Asthma   . Heart murmur     childhood     Past Surgical History  Procedure Laterality Date  . Hernia repair       Family History  Problem Relation Age of Onset  . Adopted: Yes     History   Social History  . Marital Status: Divorced    Spouse Name: N/A  . Number of Children: 1  . Years of Education: 14   Occupational History  . Bartender    Social History Main Topics  . Smoking status: Current Every Day Smoker -- 1.00 packs/day for 38 years  . Smokeless tobacco: Never Used  . Alcohol Use: 21.0 oz/week    35 Cans of beer per week  . Drug Use: Yes    Special: Amphetamines, LSD     Comment: occasionally  . Sexual Activity: Not on file   Other Topics Concern  . Not on file   Social History Narrative   Fun: Go to shows, occasionally workout.    Denies religious beliefs effecting health care.     Review of Systems  Constitutional: Negative for fever and chills.  Gastrointestinal: Positive for constipation, blood in stool and abdominal distention. Negative for nausea, vomiting and abdominal pain.  Musculoskeletal: Positive for back pain.  Neurological: Positive for headaches. Negative for weakness and numbness.      Objective:  BP 144/100 mmHg  Pulse 74  Temp(Src) 98.2 F (36.8 C) (Oral)  Resp 18  Ht 5\' 11"  (1.803 m)  Wt 161 lb 6.4 oz (73.211 kg)  BMI 22.52 kg/m2  SpO2 97% Nursing note and vital signs reviewed.  Physical Exam  Constitutional: He is oriented to person, place, and time. He appears well-developed and well-nourished. No distress.  Cardiovascular: Normal rate, regular rhythm, normal heart sounds and intact distal pulses.   Pulmonary/Chest: Effort normal and breath sounds normal.  Genitourinary: Rectal exam shows internal hemorrhoid. Rectal exam shows no external hemorrhoid, no fissure, no mass, no tenderness and anal tone normal.  Musculoskeletal:  Low back with tattoo noted and no obvious deformity, discoloration, or edema. Mild  tenderness elicited over paraspinal musculature of the lumbar spine. Range of motion is full in all directions with mild discomfort and full flexion. Straight leg raise is negative. Distal pulses, sensation, and reflexes are intact and appropriate.  Neurological: He is alert and oriented to person, place, and time.  Skin: Skin is warm and dry.  Approximately 1/4 cm annular papule with well defined borders appears brown in color and located on upper back and area of vertebral border of the scapula on the right side.  Psychiatric: He has a normal mood and affect. His behavior is normal. Judgment and thought content normal.       Assessment & Plan:   Problem List Items Addressed This Visit      Cardiovascular and Mediastinum   Hemorrhoid - Primary    Symptom exam consistent with hemorrhoid irritation secondary to waxing and waning constipation. Start Colace. Continue drinking plenty of fluids and increase dietary fiber. Sitz baths as needed. Start over-the-counter medications as needed for hemorrhoid relief. Follow up if symptoms worsen or are not well controlled with current treatment regimen.        Musculoskeletal and Integument   Rash    Mole with well circumscribed borders consistent with potential seborrheic  Keratosis. Refer to dermatology for further assessment and possible biopsy.      Relevant Orders   Ambulatory referral to Dermatology     Other   Low back pain    Low back pain consistent with overuse secondary to lifting and tight musculature. Treat conservatively with home therapy at this time with ice/heat, stretching, and over-the-counter anti-inflammatories as needed for discomfort. Follow up if symptoms worsen or fail to improve.       Other Visit Diagnoses    At risk for colon cancer        Relevant Orders    Ambulatory referral to Gastroenterology    Blood tests for routine general physical examination        Relevant Orders    PSA (Completed)    Lipid panel  (Completed)    Basic metabolic panel (Completed)    TSH (Completed)    CBC (Completed)

## 2014-09-26 NOTE — Progress Notes (Signed)
Pre visit review using our clinic review tool, if applicable. No additional management support is needed unless otherwise documented below in the visit note. 

## 2014-09-27 ENCOUNTER — Telehealth: Payer: Self-pay | Admitting: Family

## 2014-09-27 DIAGNOSIS — R21 Rash and other nonspecific skin eruption: Secondary | ICD-10-CM | POA: Insufficient documentation

## 2014-09-27 NOTE — Telephone Encounter (Signed)
Please inform patient that his blood work shows that his kidney function, electrolytes, white/red blood cells, thyroid function, prostate and cholesterol are all within the normal limits. Therefore no changes are needed at this time.

## 2014-09-27 NOTE — Assessment & Plan Note (Signed)
Mole with well circumscribed borders consistent with potential seborrheic  Keratosis. Refer to dermatology for further assessment and possible biopsy.

## 2014-09-27 NOTE — Assessment & Plan Note (Signed)
Symptom exam consistent with hemorrhoid irritation secondary to waxing and waning constipation. Start Colace. Continue drinking plenty of fluids and increase dietary fiber. Sitz baths as needed. Start over-the-counter medications as needed for hemorrhoid relief. Follow up if symptoms worsen or are not well controlled with current treatment regimen.

## 2014-09-27 NOTE — Assessment & Plan Note (Signed)
Low back pain consistent with overuse secondary to lifting and tight musculature. Treat conservatively with home therapy at this time with ice/heat, stretching, and over-the-counter anti-inflammatories as needed for discomfort. Follow up if symptoms worsen or fail to improve.

## 2014-09-29 ENCOUNTER — Encounter: Payer: Self-pay | Admitting: Internal Medicine

## 2014-09-29 NOTE — Telephone Encounter (Signed)
LVM for pt. Sending labs in the mail.

## 2014-11-10 ENCOUNTER — Ambulatory Visit (AMBULATORY_SURGERY_CENTER): Payer: Self-pay | Admitting: *Deleted

## 2014-11-10 VITALS — Ht 71.0 in | Wt 163.0 lb

## 2014-11-10 DIAGNOSIS — Z1211 Encounter for screening for malignant neoplasm of colon: Secondary | ICD-10-CM

## 2014-11-10 NOTE — Progress Notes (Signed)
Patient denies any allergies to eggs or soy. Patient denies any problems with anesthesia/sedation. Patient denies any oxygen use at home and does not take any diet/weight loss medications. EMMI education assisgned to patient on colonoscopy, this was explained and instructions given to patient. 

## 2014-11-24 ENCOUNTER — Encounter: Payer: Self-pay | Admitting: Internal Medicine

## 2014-11-24 ENCOUNTER — Ambulatory Visit (AMBULATORY_SURGERY_CENTER): Payer: 59 | Admitting: Internal Medicine

## 2014-11-24 VITALS — BP 134/71 | HR 71 | Temp 96.8°F | Resp 14 | Ht 71.0 in | Wt 163.0 lb

## 2014-11-24 DIAGNOSIS — Z1211 Encounter for screening for malignant neoplasm of colon: Secondary | ICD-10-CM | POA: Diagnosis not present

## 2014-11-24 DIAGNOSIS — D123 Benign neoplasm of transverse colon: Secondary | ICD-10-CM | POA: Diagnosis not present

## 2014-11-24 MED ORDER — SODIUM CHLORIDE 0.9 % IV SOLN: 500.0000 mL | INTRAVENOUS | Status: DC

## 2014-11-24 NOTE — Op Note (Signed)
[place]  [address] [place], [address]   COLONOSCOPY PROCEDURE REPORT  PATIENT: Roberto Jenkins, Roberto Jenkins  MR#: [id number] BIRTHDATE: September 24, 1960 , 29  yrs. old GENDER: male ENDOSCOPIST: Gatha Mayer, MD, [facility] PROCEDURE DATE:  11/24/2014 PROCEDURE:   Colonoscopy, screening and Colonoscopy with biopsy First Screening Colonoscopy - Avg.  risk and is 50 yrs.  old or older Yes.  Prior Negative Screening - Now for repeat screening. N/A  History of Adenoma - Now for follow-up colonoscopy & has been > or = to 3 yrs.  N/A  Polyps removed today? Yes ASA CLASS:   Class II INDICATIONS:Screening for colonic neoplasia and Colorectal Neoplasm Risk Assessment for this procedure is average risk. MEDICATIONS: Propofol 300 mg IV and Monitored anesthesia care  DESCRIPTION OF PROCEDURE:   After the risks benefits and alternatives of the procedure were thoroughly explained, informed consent was obtained.  The digital rectal exam revealed no rectal mass and revealed an enlarged prostate.   The LB [id number]-H190 [id number] endoscope was introduced through the anus and advanced to the cecum, which was identified by both the appendix and ileocecal valve. No adverse events experienced.   The quality of the prep was good.  (MiraLax was used)  The instrument was then slowly withdrawn as the colon was fully examined. Estimated blood loss is zero unless otherwise noted in this procedure report.      COLON FINDINGS: A polypoid shaped sessile polyp measuring 2 mm in size was found in the transverse colon.  A polypectomy was performed with cold forceps.  The resection was complete, the polyp tissue was completely retrieved and sent to histology.   The examination was otherwise normal.   Right colon retroflexion included.  Retroflexed views revealed no abnormalities. The time to cecum = 3.2 Withdrawal time = 8.5   The scope was withdrawn and the procedure completed. COMPLICATIONS: There were no  immediate complications.  ENDOSCOPIC IMPRESSION: 1.   Sessile polyp was found in the transverse colon; polypectomy was performed with cold forceps 2.   The examination was otherwise normal 3.   Enlarged (moderate) prostate, no nodules  RECOMMENDATIONS: Timing of repeat colonoscopy will be determined by pathology findings. Keep f/u PCP withannual prostate exam (PSA in July 0.16)  eSigned:  Gatha Mayer, MD, [facility] 11/24/2014 3:22 PM   cc: Terri Piedra, NP and [facility]

## 2014-11-24 NOTE — Progress Notes (Signed)
Called to room to assist during endoscopic procedure.  Patient ID and intended procedure confirmed with present staff. Received instructions for my participation in the procedure from the performing physician.  

## 2014-11-24 NOTE — Patient Instructions (Addendum)
I found and removed one tiny polyp. All else ok in the colon. Prostate was enlarged but no nodules.  I will let you know pathology results and when to have another routine colonoscopy by mail. Keep regular follow-up on prostate exam.  Gatha Mayer, MD, [facility]   Discharge instructions given. Handout on polyps. Resume previous medications. YOU HAD AN ENDOSCOPIC PROCEDURE TODAY AT [facility]:   Refer to the procedure report that was given to you for any specific questions about what was found during the examination.  If the procedure report does not answer your questions, please call your gastroenterologist to clarify.  If you requested that your care partner not be given the details of your procedure findings, then the procedure report has been included in a sealed envelope for you to review at your convenience later.  YOU SHOULD EXPECT: Some feelings of bloating in the abdomen. Passage of more gas than usual.  Walking can help get rid of the air that was put into your GI tract during the procedure and reduce the bloating. If you had a lower endoscopy (such as a colonoscopy or flexible sigmoidoscopy) you may notice spotting of blood in your stool or on the toilet paper. If you underwent a bowel prep for your procedure, you may not have a normal bowel movement for a few days.  Please Note:  You might notice some irritation and congestion in your nose or some drainage.  This is from the oxygen used during your procedure.  There is no need for concern and it should clear up in a day or so.  SYMPTOMS TO REPORT IMMEDIATELY:   Following lower endoscopy (colonoscopy or flexible sigmoidoscopy):  Excessive amounts of blood in the stool  Significant tenderness or worsening of abdominal pains  Swelling of the abdomen that is new, acute  Fever of 100F or higher   For urgent or emergent issues, a gastroenterologist can be reached at any hour by calling [phone]  [phone].   DIET: Your first meal following the procedure should be a small meal and then it is ok to progress to your normal diet. Heavy or fried foods are harder to digest and may make you feel nauseous or bloated.  Likewise, meals heavy in dairy and vegetables can increase bloating.  Drink plenty of fluids but you should avoid alcoholic beverages for 24 hours.  ACTIVITY:  You should plan to take it easy for the rest of today and you should NOT DRIVE or use heavy machinery until tomorrow (because of the sedation medicines used during the test).    FOLLOW UP: Our staff will call the number listed on your records the next business day following your procedure to check on you and address any questions or concerns that you may have regarding the information given to you following your procedure. If we do not reach you, we will leave a message.  However, if you are feeling well and you are not experiencing any problems, there is no need to return our call.  We will assume that you have returned to your regular daily activities without incident.  If any biopsies were taken you will be contacted by phone or by letter within the next 1-3 weeks.  Please call us at [phone] if you have not heard about the biopsies in 3 weeks.    SIGNATURES/CONFIDENTIALITY: You and/or your care partner have signed paperwork which will be entered into your electronic medical record.  These signatures attest  to the fact that that the information above on your After Visit Summary has been reviewed and is understood.  Full responsibility of the confidentiality of this discharge information lies with you and/or your care-partner. 

## 2014-11-24 NOTE — Progress Notes (Signed)
Report to PACU, RN, vss, BBS= Clear.  

## 2014-11-25 ENCOUNTER — Telehealth: Payer: Self-pay

## 2014-11-25 NOTE — Telephone Encounter (Signed)
Left a message at [phone] with ID on answering machine for the pt to call us back if any questions or concerns. maw

## 2014-12-02 ENCOUNTER — Encounter: Payer: Self-pay | Admitting: Internal Medicine

## 2014-12-02 DIAGNOSIS — Z86010 Personal history of colonic polyps: Secondary | ICD-10-CM

## 2014-12-02 DIAGNOSIS — Z860101 Personal history of adenomatous and serrated colon polyps: Secondary | ICD-10-CM | POA: Insufficient documentation

## 2014-12-02 HISTORY — DX: Personal history of colonic polyps: Z86.010

## 2014-12-02 HISTORY — DX: Personal history of adenomatous and serrated colon polyps: Z86.0101

## 2014-12-02 NOTE — Progress Notes (Signed)
Quick Note:  2 mm adenoma Repeat colonoscopy 2023 ______

## 2015-01-21 ENCOUNTER — Encounter: Payer: Self-pay | Admitting: Family

## 2015-01-25 ENCOUNTER — Other Ambulatory Visit: Payer: Self-pay | Admitting: Family

## 2015-01-25 DIAGNOSIS — N529 Male erectile dysfunction, unspecified: Secondary | ICD-10-CM

## 2015-01-28 ENCOUNTER — Other Ambulatory Visit (INDEPENDENT_AMBULATORY_CARE_PROVIDER_SITE_OTHER): Payer: 59

## 2015-01-28 DIAGNOSIS — N529 Male erectile dysfunction, unspecified: Secondary | ICD-10-CM | POA: Diagnosis not present

## 2015-01-28 LAB — TESTOSTERONE: TESTOSTERONE: 437.49 ng/dL (ref 300.00–890.00)

## 2015-01-29 ENCOUNTER — Encounter: Payer: Self-pay | Admitting: Family

## 2015-01-29 LAB — PROLACTIN: Prolactin: 7.2 ng/mL (ref 2.1–17.1)

## 2015-02-05 ENCOUNTER — Ambulatory Visit (INDEPENDENT_AMBULATORY_CARE_PROVIDER_SITE_OTHER): Payer: 59 | Admitting: Family

## 2015-02-05 ENCOUNTER — Encounter: Payer: Self-pay | Admitting: Family

## 2015-02-05 VITALS — BP 118/82 | HR 65 | Temp 98.4°F | Resp 18 | Ht 71.0 in | Wt 164.0 lb

## 2015-02-05 DIAGNOSIS — N529 Male erectile dysfunction, unspecified: Secondary | ICD-10-CM

## 2015-02-05 DIAGNOSIS — Z23 Encounter for immunization: Secondary | ICD-10-CM

## 2015-02-05 MED ORDER — SILDENAFIL CITRATE 100 MG PO TABS: 50.0000 mg | ORAL_TABLET | Freq: Every day | ORAL | Status: DC | PRN

## 2015-02-05 NOTE — Patient Instructions (Signed)
Erectile Dysfunction  Erectile dysfunction is the inability to get or sustain a good enough erection to have sexual intercourse. Erectile dysfunction may involve:   Inability to get an erection.   Lack of enough hardness to allow penetration.   Loss of the erection before sex is finished.   Premature ejaculation.  CAUSES   Certain drugs, such as:    Pain relievers.    Antihistamines.    Antidepressants.    Blood pressure medicines.    Water pills (diuretics).    Ulcer medicines.    Muscle relaxants.    Illegal drugs.   Excessive drinking.   Psychological causes, such as:    Anxiety.    Depression.    Sadness.    Exhaustion.    Performance fear.    Stress.   Physical causes, such as:    Artery problems. This may include diabetes, smoking, liver disease, or atherosclerosis.    High blood pressure.    Hormonal problems, such as low testosterone.    Obesity.    Nerve problems. This may include back or pelvic injuries, diabetes mellitus, multiple sclerosis, or Parkinson disease.  SYMPTOMS   Inability to get an erection.   Lack of enough hardness to allow penetration.   Loss of the erection before sex is finished.   Premature ejaculation.   Normal erections at some times, but with frequent unsatisfactory episodes.   Orgasms that are not satisfactory in sensation or frequency.   Low sexual satisfaction in either partner because of erection problems.   A curved penis occurring with erection. The curve may cause pain or may be too curved to allow for intercourse.   Never having nighttime erections.  DIAGNOSIS  Your caregiver can often diagnose this condition by:   Performing a physical exam to find other diseases or specific problems with the penis.   Asking you detailed questions about the problem.   Performing blood tests to check for diabetes mellitus or to measure hormone levels.   Performing urine tests to find other underlying health conditions.   Performing an ultrasound exam to check for  scarring.   Performing a test to check blood flow to the penis.   Doing a sleep study at home to measure nighttime erections.  TREATMENT    You may be prescribed medicines by mouth.   You may be given medicine injections into the penis.   You may be prescribed a vacuum pump with a ring.   Penile implant surgery may be performed. You may receive:    An inflatable implant.    A semirigid implant.   Blood vessel surgery may be performed.  HOME CARE INSTRUCTIONS   If you are prescribed oral medicine, you should take the medicine as prescribed. Do not increase the dosage without first discussing it with your physician.   If you are using self-injections, be careful to avoid any veins that are on the surface of the penis. Apply pressure to the injection site for 5 minutes.   If you are using a vacuum pump, make sure you have read the instructions before using it. Discuss any questions with your physician before taking the pump home.  SEEK MEDICAL CARE IF:   You experience pain that is not responsive to the pain medicine you have been prescribed.   You experience nausea or vomiting.  SEEK IMMEDIATE MEDICAL CARE IF:    When taking oral or injectable medications, you experience an erection that lasts longer than 4 hours. If your   physician is unavailable, go to the nearest emergency room for evaluation. An erection that lasts much longer than 4 hours can result in permanent damage to your penis.   You have pain that is severe.   You develop redness, severe pain, or severe swelling of your penis.   You have redness spreading up into your groin or lower abdomen.   You are unable to pass your urine.     This information is not intended to replace advice given to you by your health care provider. Make sure you discuss any questions you have with your health care provider.     Document Released: 02/12/2000 Document Revised: 10/17/2012 Document Reviewed: 07/19/2012  Elsevier Interactive Patient Education 2016  [employer] Inc[employer]

## 2015-02-05 NOTE — Progress Notes (Signed)
Pre visit review using our clinic review tool, if applicable. No additional management support is needed unless otherwise documented below in the visit note. 

## 2015-02-05 NOTE — Assessment & Plan Note (Signed)
Erectile dysfunction of undetermined metabolic cause, however cannot rule out psychological cause. Discussed first line treatment options of phosphodiesterase type V inhibitors including risk and benefits. Start Viagra. Follow up pending trial of medication. Instructed to seek emergency care if he has a sustained erection lasting greater than 4 hours.

## 2015-02-05 NOTE — Progress Notes (Signed)
   Subjective:    Patient ID: Roberto Jenkins, male    DOB: 07-Aug-1960, 54 y.o.   MRN: [id number]  Chief Complaint  Patient presents with  . Lab work    testosterone    HPI:  Roberto Jenkins is a 54 y.o. male who  has a past medical history of Asthma; Heart murmur; and adenomatous polyp of colon (12/02/2014). and presents today for a follow up office visit.   1.) Erectile dysfunction - Associated symptom of difficulty maintaining an erection has has been going on for about 1 month. Is able to obtain an erection. Was recently on the Nugenics supplement as a testosterone booster. Recent blood work for testosterone and prolactin were normal.    No Known Allergies   No current outpatient prescriptions on file prior to visit.   No current facility-administered medications on file prior to visit.    Review of Systems  Constitutional: Negative for fever and chills.  Cardiovascular: Negative for chest pain, palpitations and leg swelling.  Endocrine: Negative for cold intolerance, heat intolerance, polydipsia, polyphagia and polyuria.  Genitourinary: Negative for dysuria, urgency, frequency, hematuria, penile swelling, penile pain and testicular pain.      Objective:    BP 118/82 mmHg  Pulse 65  Temp(Src) 98.4 F (36.9 C) (Oral)  Resp 18  Ht 5\' 11"  (1.803 m)  Wt 164 lb (74.39 kg)  BMI 22.88 kg/m2 Nursing note and vital signs reviewed.  Physical Exam  Constitutional: He is oriented to person, place, and time. He appears well-developed and well-nourished. No distress.  Cardiovascular: Normal rate, regular rhythm, normal heart sounds and intact distal pulses.   Pulmonary/Chest: Effort normal and breath sounds normal.  Neurological: He is alert and oriented to person, place, and time.  Skin: Skin is warm and dry.  Psychiatric: He has a normal mood and affect. His behavior is normal. Judgment and thought content normal.       Assessment & Plan:   Problem List Items Addressed This  Visit      Genitourinary   Erectile dysfunction - Primary    Erectile dysfunction of undetermined metabolic cause, however cannot rule out psychological cause. Discussed first line treatment options of phosphodiesterase type V inhibitors including risk and benefits. Start Viagra. Follow up pending trial of medication. Instructed to seek emergency care if he has a sustained erection lasting greater than 4 hours.

## 2015-04-13 ENCOUNTER — Encounter: Payer: Self-pay | Admitting: Family

## 2015-04-13 MED ORDER — TADALAFIL 20 MG PO TABS: 10.0000 mg | ORAL_TABLET | ORAL | Status: DC | PRN

## 2015-04-20 ENCOUNTER — Telehealth: Payer: Self-pay

## 2015-04-20 NOTE — Telephone Encounter (Signed)
PA initiated via CoverMyMeds Key [phone]

## 2015-04-20 NOTE — Telephone Encounter (Signed)
PA Approved via CoverMyMeds 

## 2015-08-06 ENCOUNTER — Other Ambulatory Visit: Payer: Self-pay | Admitting: Family

## 2016-12-21 ENCOUNTER — Ambulatory Visit: Payer: Self-pay | Admitting: Nurse Practitioner

## 2017-02-08 ENCOUNTER — Ambulatory Visit (INDEPENDENT_AMBULATORY_CARE_PROVIDER_SITE_OTHER)
Admission: RE | Admit: 2017-02-08 | Discharge: 2017-02-08 | Disposition: A | Discharge status: Home / Self Care | Payer: BLUE CROSS/BLUE SHIELD | Source: Ambulatory Visit | Attending: Nurse Practitioner | Admitting: Nurse Practitioner

## 2017-02-08 ENCOUNTER — Ambulatory Visit: Payer: BLUE CROSS/BLUE SHIELD | Admitting: Nurse Practitioner

## 2017-02-08 ENCOUNTER — Encounter: Payer: Self-pay | Admitting: Nurse Practitioner

## 2017-02-08 ENCOUNTER — Other Ambulatory Visit (INDEPENDENT_AMBULATORY_CARE_PROVIDER_SITE_OTHER): Payer: BLUE CROSS/BLUE SHIELD

## 2017-02-08 VITALS — BP 140/92 | HR 92 | Temp 98.9°F | Resp 16 | Ht 71.0 in | Wt 161.0 lb

## 2017-02-08 DIAGNOSIS — Z789 Other specified health status: Secondary | ICD-10-CM | POA: Diagnosis not present

## 2017-02-08 DIAGNOSIS — Z13220 Encounter for screening for lipoid disorders: Secondary | ICD-10-CM | POA: Diagnosis not present

## 2017-02-08 DIAGNOSIS — M79642 Pain in left hand: Secondary | ICD-10-CM

## 2017-02-08 DIAGNOSIS — R42 Dizziness and giddiness: Secondary | ICD-10-CM

## 2017-02-08 DIAGNOSIS — L989 Disorder of the skin and subcutaneous tissue, unspecified: Secondary | ICD-10-CM | POA: Diagnosis not present

## 2017-02-08 DIAGNOSIS — Z125 Encounter for screening for malignant neoplasm of prostate: Secondary | ICD-10-CM | POA: Diagnosis not present

## 2017-02-08 DIAGNOSIS — R030 Elevated blood-pressure reading, without diagnosis of hypertension: Secondary | ICD-10-CM | POA: Diagnosis not present

## 2017-02-08 DIAGNOSIS — Z23 Encounter for immunization: Secondary | ICD-10-CM | POA: Diagnosis not present

## 2017-02-08 DIAGNOSIS — Z1159 Encounter for screening for other viral diseases: Secondary | ICD-10-CM

## 2017-02-08 DIAGNOSIS — R000 Tachycardia, unspecified: Secondary | ICD-10-CM

## 2017-02-08 DIAGNOSIS — Z114 Encounter for screening for human immunodeficiency virus [HIV]: Secondary | ICD-10-CM

## 2017-02-08 DIAGNOSIS — Z9189 Other specified personal risk factors, not elsewhere classified: Secondary | ICD-10-CM

## 2017-02-08 DIAGNOSIS — Z0001 Encounter for general adult medical examination with abnormal findings: Secondary | ICD-10-CM | POA: Diagnosis not present

## 2017-02-08 DIAGNOSIS — I10 Essential (primary) hypertension: Secondary | ICD-10-CM | POA: Insufficient documentation

## 2017-02-08 LAB — PSA: PSA: 0.21 ng/mL (ref 0.10–4.00)

## 2017-02-08 LAB — LIPID PANEL
Cholesterol: 201 mg/dL — ABNORMAL HIGH (ref 0–200)
HDL: 129.7 mg/dL (ref >39.00)
LDL Cholesterol: 64 mg/dL (ref 0–99)
NonHDL: 71.32
TRIGLYCERIDES: 36 mg/dL (ref 0.0–149.0)
Total CHOL/HDL Ratio: 2
VLDL: 7.2 mg/dL (ref 0.0–40.0)

## 2017-02-08 LAB — COMPREHENSIVE METABOLIC PANEL
ALK PHOS: 53 U/L (ref 39–117)
ALT: 80 U/L — AB (ref 0–53)
AST: 86 U/L — ABNORMAL HIGH (ref 0–37)
Albumin: 4.8 g/dL (ref 3.5–5.2)
BILIRUBIN TOTAL: 1.2 mg/dL (ref 0.2–1.2)
BUN: 9 mg/dL (ref 6–23)
CALCIUM: 9.5 mg/dL (ref 8.4–10.5)
CO2: 31 mEq/L (ref 19–32)
Chloride: 101 mEq/L (ref 96–112)
Creatinine, Ser: 0.76 mg/dL (ref 0.40–1.50)
GFR: 112.4 mL/min (ref >60.00)
Glucose, Bld: 99 mg/dL (ref 70–99)
Potassium: 4 mEq/L (ref 3.5–5.1)
Sodium: 141 mEq/L (ref 135–145)
TOTAL PROTEIN: 7 g/dL (ref 6.0–8.3)

## 2017-02-08 LAB — HEMOGLOBIN A1C: HEMOGLOBIN A1C: 4.6 % (ref 4.6–6.5)

## 2017-02-08 LAB — TSH: TSH: 2.44 u[IU]/mL (ref 0.35–4.50)

## 2017-02-08 MED ORDER — AMLODIPINE BESYLATE 5 MG PO TABS: 5.0000 mg | ORAL_TABLET | Freq: Every day | ORAL | 3 refills | Status: DC

## 2017-02-08 NOTE — Assessment & Plan Note (Addendum)
Elevated blood pressure reading 2 elevated readings in office today, one reading 160/100 He denies headaches, vision changes, chest pain, shortness of breath. He feels his BP is related to recent increase in life stressors. I recommended initiation of BP medication today as his BP is quite elevated at 160/100. He agrees to initiate BP medication. - amLODipine (NORVASC) 5 MG tablet; Take 1 tablet (5 mg total) by mouth daily.  Dispense: 90 tablet; Refill: 3 We discussed common side effects of amlodipine including dizziness, edema. Follow up in 2 weeks for BP management.

## 2017-02-08 NOTE — Assessment & Plan Note (Signed)
Healthy diet and exercise discussed including reduced red meat and 1/2 plate of veggies at meals, getting 150 minutes of exercise per week. Reduction of alcohol discussed. Sunscreen, seatbelt use encouraged. Preventive care handout given.  - Comprehensive metabolic panel; Future - Hepatitis C antibody; Future- screening - HIV antibody; Future-screening - Lipid panel; Future-screening - TSH; Future - CBC w/Diff - Hemoglobin A1c; Future -PSA; future- screening

## 2017-02-08 NOTE — Assessment & Plan Note (Addendum)
He consumes About 5 drinks daily. We discussed the risks of drinking >2 drinks per day including negative health effects and addiction, and the decreased ability of the body to break down alcohol as quickly with aging.  Alcohol use handout given. He is not ready to quit drinking. I encouraged decreasing daily alcohol intake.
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
## 2017-02-08 NOTE — Patient Instructions (Addendum)
Please head downstairs for lab work/x-rays.  I have placed a referral to cardiology. Our office will call you to schedule this appointment. You should hear from our office in 7-10 days.  I have sent a new prescription for you for your blood pressure. Please begin amlodipine 76m once daily for your blood pressure. Please return in about 2 weeks, so we can see if your blood pressure has improved.  If you have any trouble scheduling an appointment with your dermatologist, please let me know and I can place a referral.  It was nice to meet you. Thanks for letting me take care of you today :)   Preventive Care 40-64 Years, Male Preventive care refers to lifestyle choices and visits with your health care provider that can promote health and wellness. What does preventive care include?  A yearly physical exam. This is also called an annual well check.  Dental exams once or twice a year.  Routine eye exams. Ask your health care provider how often you should have your eyes checked.  Personal lifestyle choices, including: ? Daily care of your teeth and gums. ? Regular physical activity. ? Eating a healthy diet. ? Avoiding tobacco and drug use. ? Limiting alcohol use. ? Practicing safe sex. ? Taking low-dose aspirin every day starting at age 56 What happens during an annual well check? The services and screenings done by your health care provider during your annual well check will depend on your age, overall health, lifestyle risk factors, and family history of disease. Counseling Your health care provider may ask you questions about your:  Alcohol use.  Tobacco use.  Drug use.  Emotional well-being.  Home and relationship well-being.  Sexual activity.  Eating habits.  Work and work eStatistician  Screening You may have the following tests or measurements:  Height, weight, and BMI.  Blood pressure.  Lipid and cholesterol levels. These may be checked every 5 years, or  more frequently if you are over 531years old.  Skin check.  Lung cancer screening. You may have this screening every year starting at age 8732if you have a 30-pack-year history of smoking and currently smoke or have quit within the past 15 years.  Fecal occult blood test (FOBT) of the stool. You may have this test every year starting at age 56  Flexible sigmoidoscopy or colonoscopy. You may have a sigmoidoscopy every 5 years or a colonoscopy every 10 years starting at age 56  Prostate cancer screening. Recommendations will vary depending on your family history and other risks.  Hepatitis C blood test.  Hepatitis B blood test.  Sexually transmitted disease (STD) testing.  Diabetes screening. This is done by checking your blood sugar (glucose) after you have not eaten for a while (fasting). You may have this done every 1-3 years.  Discuss your test results, treatment options, and if necessary, the need for more tests with your health care provider. Vaccines Your health care provider may recommend certain vaccines, such as:  Influenza vaccine. This is recommended every year.  Tetanus, diphtheria, and acellular pertussis (Tdap, Td) vaccine. You may need a Td booster every 10 years.  Varicella vaccine. You may need this if you have not been vaccinated.  Zoster vaccine. You may need this after age 56  Measles, mumps, and rubella (MMR) vaccine. You may need at least one dose of MMR if you were born in 1957 or later. You may also need a second dose.  Pneumococcal 13-valent conjugate (PCV13) vaccine. You  may need this if you have certain conditions and have not been vaccinated.  Pneumococcal polysaccharide (PPSV23) vaccine. You may need one or two doses if you smoke cigarettes or if you have certain conditions.  Meningococcal vaccine. You may need this if you have certain conditions.  Hepatitis A vaccine. You may need this if you have certain conditions or if you travel or work in  places where you may be exposed to hepatitis A.  Hepatitis B vaccine. You may need this if you have certain conditions or if you travel or work in places where you may be exposed to hepatitis B.  Haemophilus influenzae type b (Hib) vaccine. You may need this if you have certain risk factors.  Talk to your health care provider about which screenings and vaccines you need and how often you need them. This information is not intended to replace advice given to you by your health care provider. Make sure you discuss any questions you have with your health care provider. Document Released: 03/13/2015 Document Revised: 11/04/2015 Document Reviewed: 12/16/2014 Elsevier Interactive Patient Education  2017 [employer]   Alcohol Use Disorder Alcohol use disorder is when your drinking disrupts your daily life. When you have this condition, you drink too much alcohol and you cannot control your drinking. Alcohol use disorder can cause serious problems with your physical health. It can affect your brain, heart, liver, pancreas, immune system, stomach, and intestines. Alcohol use disorder can increase your risk for certain cancers and cause problems with your mental health, such as depression, anxiety, psychosis, delirium, and dementia. People with this disorder risk hurting themselves and others. What are the causes? This condition is caused by drinking too much alcohol over time. It is not caused by drinking too much alcohol only one or two times. Some people with this condition drink alcohol to cope with or escape from negative life events. Others drink to relieve pain or symptoms of mental illness. What increases the risk? You are more likely to develop this condition if:  You have a family history of alcohol use disorder.  Your culture encourages drinking to the point of intoxication, or makes alcohol easy to get.  You had a mood or conduct disorder in childhood.  You have been a victim of  abuse.  You are an adolescent and: ? You have poor grades or difficulties in school. ? Your caregivers do not talk to you about saying no to alcohol, or supervise your activities. ? You are impulsive or you have trouble with self-control.  What are the signs or symptoms? Symptoms of this condition include:  Drinkingmore than you want to.  Drinking for longer than you want to.  Trying several times to drink less or to control your drinking.  Spending a lot of time getting alcohol, drinking, or recovering from drinking.  Craving alcohol.  Having problems at work, at school, or at home due to drinking.  Having problems in relationships due to drinking.  Drinking when it is dangerous to drink, such as before driving a car.  Continuing to drink even though you know you might have a physical or mental problem related to drinking.  Needing more and more alcohol to get the same effect you want from the alcohol (building up tolerance).  Having symptoms of withdrawal when you stop drinking. Symptoms of withdrawal include: ? Fatigue. ? Nightmares. ? Trouble sleeping. ? Depression. ? Anxiety. ? Fever. ? Seizures. ? Severe confusion. ? Feeling or seeing things that are not  there (hallucinations). ? Tremors. ? Rapid heart rate. ? Rapid breathing. ? High blood pressure.  Drinking to avoid symptoms of withdrawal.  How is this diagnosed? This condition is diagnosed with an assessment. Your health care provider may start the assessment by asking three or four questions about your drinking. Your health care provider may perform a physical exam or do lab tests to see if you have physical problems resulting from alcohol use. She or he may refer you to a mental health professional for evaluation. How is this treated? Some people with alcohol use disorder are able to reduce their alcohol use to low-risk levels. Others need to completely quit drinking alcohol. When necessary, mental  health professionals with specialized training in substance use treatment can help. Your health care provider can help you decide how severe your alcohol use disorder is and what type of treatment you need. The following forms of treatment are available:  Detoxification. Detoxification involves quitting drinking and using prescription medicines within the first week to help lessen withdrawal symptoms. This treatment is important for people who have had withdrawal symptoms before and for heavy drinkers who are likely to have withdrawal symptoms. Alcohol withdrawal can be dangerous, and in severe cases, it can cause death. Detoxification may be provided in a home, community, or primary care setting, or in a hospital or substance use treatment facility.  Counseling. This treatment is also called talk therapy. It is provided by substance use treatment counselors. A counselor can address the reasons you use alcohol and suggest ways to keep you from drinking again or to prevent problem drinking. The goals of talk therapy are to: ? Find healthy activities and ways for you to cope with stress. ? Identify and avoid the things that trigger your alcohol use. ? Help you learn how to handle cravings.  Medicines.Medicines can help treat alcohol use disorder by: ? Decreasing alcohol cravings. ? Decreasing the positive feeling you have when you drink alcohol. ? Causing an uncomfortable physical reaction when you drink alcohol (aversion therapy).  Support groups. Support groups are led by people who have quit drinking. They provide emotional support, advice, and guidance.  These forms of treatment are often combined. Some people with this condition benefit from a combination of treatments provided by specialized substance use treatment centers. Follow these instructions at home:  Take over-the-counter and prescription medicines only as told by your health care provider.  Check with your health care provider  before starting any new medicines.  Ask friends and family members not to offer you alcohol.  Avoid situations where alcohol is served, including gatherings where others are drinking alcohol.  Create a plan for what to do when you are tempted to use alcohol.  Find hobbies or activities that you enjoy that do not include alcohol.  Keep all follow-up visits as told by your health care provider. This is important. How is this prevented?  If you drink, limit alcohol intake to no more than 1 drink a day for nonpregnant women and 2 drinks a day for men. One drink equals 12 oz of beer, 5 oz of wine, or 1 oz of hard liquor.  If you have a mental health condition, get treatment and support.  Do not give alcohol to adolescents.  If you are an adolescent: ? Do not drink alcohol. ? Do not be afraid to say no if someone offers you alcohol. Speak up about why you do not want to drink. You can be a positive role  model for your friends and set a good example for those around you by not drinking alcohol. ? If your friends drink, spend time with others who do not drink alcohol. Make new friends who do not use alcohol. ? Find healthy ways to manage stress and emotions, such as meditation or deep breathing, exercise, spending time in nature, listening to music, or talking with a trusted friend or family member. Contact a health care provider if:  You are not able to take your medicines as told.  Your symptoms get worse.  You return to drinking alcohol (relapse) and your symptoms get worse. Get help right away if:  You have thoughts about hurting yourself or others. If you ever feel like you may hurt yourself or others, or have thoughts about taking your own life, get help right away. You can go to your nearest emergency department or call:  Your local emergency services (911 in the [place]  A suicide crisis helpline, such as the [facility] at 512-319-8246. This is open  24 hours a day.  Summary  Alcohol use disorder is when your drinking disrupts your daily life. When you have this condition, you drink too much alcohol and you cannot control your drinking.  Treatment may include detoxification, counseling, medicine, and support groups.  Ask friends and family members not to offer you alcohol. Avoid situations where alcohol is served.  Get help right away if you have thoughts about hurting yourself or others. This information is not intended to replace advice given to you by your health care provider. Make sure you discuss any questions you have with your health care provider. Document Released: 03/24/2004 Document Revised: 11/12/2015 Document Reviewed: 11/12/2015 Elsevier Interactive Patient Education  [employer].

## 2017-02-09 LAB — HIV ANTIBODY (ROUTINE TESTING W REFLEX): HIV 1&2 Ab, 4th Generation: NONREACTIVE

## 2017-02-09 LAB — HEPATITIS C ANTIBODY
Hepatitis C Ab: NONREACTIVE
SIGNAL TO CUT-OFF: 0.01 (ref <1.00)

## 2017-02-12 ENCOUNTER — Other Ambulatory Visit: Payer: Self-pay | Admitting: Nurse Practitioner

## 2017-02-12 DIAGNOSIS — R748 Abnormal levels of other serum enzymes: Secondary | ICD-10-CM

## 2017-02-12 NOTE — Progress Notes (Signed)
orders

## 2017-02-15 NOTE — Progress Notes (Signed)
Cardiology Office Note   Date:  02/17/2017   ID:  Roberto Jenkins, DOB [birth year], MRN [id number]  PCP:  Lance Sell, NP  Cardiologist:   No primary care provider on file. Referring:  Lance Sell, NP  Chief Complaint  Patient presents with  . Palpitations    NP.   Marland Kitchen Chest Pain      History of Present Illness: Roberto Jenkins is a 56 y.o. male who is referred by Lance Sell, NP for evaluation of tachycardia and dizziness.  He also describes chest pain.  He has no significant past medical history.  Apparently in his 83s he had some vague symptoms and had a treadmill test.  He has had no other cardiovascular testing.  He has been noted to have some palpitations.  He describes these as occasional skipped heartbeats.  He noticed these more with emotional stress.  He has a lot of stress particularly with a teenage son.  He feels stress in his chest.  He describes it as a tightness.  It does not seem to happen with exercise.  He walks at work and he likes to walk just to relieve his anxiety.  He denies any neck or arm discomfort.  He does clench his jaw.  He denies any presyncope or syncope.  He has no new shortness of breath, PND or orthopnea.  He has had no weight gain or edema.  Past Medical History:  Diagnosis Date  . Asthma   . Heart murmur    childhood  . Hx of adenomatous polyp of colon 12/02/2014    Past Surgical History:  Procedure Laterality Date  . COLONOSCOPY  2004   in [place];pt doesn't know MD name.  Marland Kitchen HERNIA REPAIR Bilateral   . NOSE SURGERY     x2     Current Outpatient Medications  Medication Sig Dispense Refill  . amLODipine (NORVASC) 5 MG tablet Take 1 tablet (5 mg total) by mouth daily. 90 tablet 3   No current facility-administered medications for this visit.     Allergies:   Patient has no known allergies.    Social History:  The patient  reports that he has been smoking cigarettes.  He has a 38.00 pack-year smoking  history. he has never used smokeless tobacco. He reports that he drinks about 3.0 oz of alcohol per week. He reports that he uses drugs. Drugs: Amphetamines, LSD, and Other-see comments.   Family History:  The patient's family history is not on file. He was adopted.    ROS:  Please see the history of present illness.   Otherwise, review of systems are positive for none.   All other systems are reviewed and negative.    PHYSICAL EXAM: VS:  BP (!) 142/84   Pulse 77   Ht 5\' 11"  (1.803 m)   Wt 162 lb (73.5 kg)   BMI 22.59 kg/m  , BMI Body mass index is 22.59 kg/m. GENERAL:  Well appearing HEENT:  Pupils equal round and reactive, fundi not visualized, oral mucosa unremarkable NECK:  No jugular venous distention, waveform within normal limits, carotid upstroke brisk and symmetric, no bruits, no thyromegaly LYMPHATICS:  No cervical, inguinal adenopathy LUNGS:  Clear to auscultation bilaterally BACK:  No CVA tenderness CHEST:  Unremarkable HEART:  PMI not displaced or sustained,S1 and S2 within normal limits, no S3, no S4, no clicks, no rubs, no murmurs ABD:  Flat, positive bowel sounds normal in frequency in pitch, no  bruits, no rebound, no guarding, no midline pulsatile mass, no hepatomegaly, no splenomegaly EXT:  2 plus pulses throughout, no edema, no cyanosis no clubbing SKIN:  No rashes no nodules NEURO:  Cranial nerves II through XII grossly intact, motor grossly intact throughout PSYCH:  Cognitively intact, oriented to person place and time    EKG:  EKG is ordered today. The ekg ordered today demonstrates sinus rhythm, rate 77, axis within normal limits, intervals within normal limits, no acute ST-T wave changes.   Recent Labs: 02/08/2017: ALT 80; BUN 9; Creatinine, Ser 0.76; Potassium 4.0; Sodium 141; TSH 2.44    Lipid Panel    Component Value Date/Time   CHOL 201 (H) 02/08/2017 1615   TRIG 36.0 02/08/2017 1615   HDL 129.70 02/08/2017 1615   CHOLHDL 2 02/08/2017 1615    VLDL 7.2 02/08/2017 1615   LDLCALC 64 02/08/2017 1615      Wt Readings from Last 3 Encounters:  02/17/17 162 lb (73.5 kg)  02/08/17 161 lb (73 kg)  02/05/15 164 lb (74.4 kg)      Other studies Reviewed: Additional studies/ records that were reviewed today include: Office records. Review of the above records demonstrates:  Please see elsewhere in the note.     ASSESSMENT AND PLAN:   PALPITATIONS: The patient has PVCs as noted on his EKG.  I am going to start with a POET (Plain Old Exercise Treadmill).  I will bring the patient back for a POET (Plain Old Exercise Test). This will allow me to screen for obstructive coronary disease, risk stratify and very importantly provide a prescription for exercise.  I will consider further testing such as a Holter or echo based on this result.   CHEST PAIN: I will screen with the test as above.  I think he does have a significant component of anxiety.  We talked at great length about managing this and I suggest that he see a therapist or psychiatrist and he will consider this.  ETOH: He does drink significantly and I suggest reducing this.  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  TOBACCO: He understands the need to stop smoking.  Current medicines are reviewed at length with the patient today.  The patient does not have concerns regarding medicines.  The following changes have been made:  no change  Labs/ tests ordered today include:   Orders Placed This Encounter  Procedures  . Exercise Tolerance Test  . EKG 12-Lead     Disposition:   FU with me after the POET (Plain Old Exercise Treadmill)    Signed, Minus Breeding, MD  02/17/2017 5:36 PM    [facility]

## 2017-02-17 ENCOUNTER — Ambulatory Visit: Payer: BLUE CROSS/BLUE SHIELD | Admitting: Cardiology

## 2017-02-17 ENCOUNTER — Ambulatory Visit
Admission: RE | Admit: 2017-02-17 | Discharge: 2017-02-17 | Disposition: A | Discharge status: Home / Self Care | Payer: BLUE CROSS/BLUE SHIELD | Source: Ambulatory Visit | Attending: Nurse Practitioner | Admitting: Nurse Practitioner

## 2017-02-17 ENCOUNTER — Encounter: Payer: Self-pay | Admitting: Cardiology

## 2017-02-17 VITALS — BP 142/84 | HR 77 | Ht 71.0 in | Wt 162.0 lb

## 2017-02-17 DIAGNOSIS — R748 Abnormal levels of other serum enzymes: Secondary | ICD-10-CM

## 2017-02-17 DIAGNOSIS — R002 Palpitations: Secondary | ICD-10-CM | POA: Diagnosis not present

## 2017-02-17 DIAGNOSIS — R079 Chest pain, unspecified: Secondary | ICD-10-CM

## 2017-02-17 NOTE — Patient Instructions (Signed)
Medication Instructions:  Continue current medications  If you need a refill on your cardiac medications before your next appointment, please call your pharmacy.  Labwork: None Ordered   Testing/Procedures: Your physician has requested that you have an exercise tolerance test. For further information please visit [email or web address]. Please also follow instruction sheet, as given.  Special Instructions:  Happy Holidays!!  Follow-Up: Your physician wants you to follow-up in: After Test.    Thank you for choosing [facility] at [facility]!!

## 2017-02-22 ENCOUNTER — Encounter: Payer: Self-pay | Admitting: Family

## 2017-02-22 ENCOUNTER — Ambulatory Visit: Payer: BLUE CROSS/BLUE SHIELD | Admitting: Family

## 2017-02-22 ENCOUNTER — Other Ambulatory Visit: Payer: Self-pay | Admitting: Nurse Practitioner

## 2017-02-22 VITALS — BP 124/88 | HR 73 | Temp 97.9°F | Ht 71.0 in | Wt 161.0 lb

## 2017-02-22 DIAGNOSIS — K760 Fatty (change of) liver, not elsewhere classified: Secondary | ICD-10-CM

## 2017-02-22 DIAGNOSIS — R030 Elevated blood-pressure reading, without diagnosis of hypertension: Secondary | ICD-10-CM

## 2017-02-22 NOTE — Patient Instructions (Signed)
Please start checking your blood pressure 3 x per week and bring those readings to your next OV with Ashleigh;

## 2017-02-22 NOTE — Progress Notes (Signed)
orders

## 2017-02-22 NOTE — Progress Notes (Signed)
Roberto Jenkins is a 56 y.o. male with the following history as recorded in EpicCare:  Patient Active Problem List   Diagnosis Date Noted  . Encounter for general adult medical examination with abnormal findings 02/08/2017  . Elevated blood pressure reading 02/08/2017  . Alcohol consumption heavy 02/08/2017  . Erectile dysfunction 02/05/2015  . Hx of adenomatous polyp of colon 12/02/2014  . Rash 09/27/2014  . Hemorrhoid 09/26/2014  . Low back pain 09/26/2014  . DEPRESSION 08/29/2007  . CONTACT DERMATITIS DUE TO POISON IVY 08/29/2007  . ELBOW INJURY 08/29/2007  . PERSONAL HISTORY OTHER DISORDER URINARY SYSTEM 08/29/2007    Current Outpatient Medications  Medication Sig Dispense Refill  . amLODipine (NORVASC) 5 MG tablet Take 1 tablet (5 mg total) by mouth daily. 90 tablet 3   No current facility-administered medications for this visit.     Allergies: Patient has no known allergies.  Past Medical History:  Diagnosis Date  . Asthma   . Heart murmur    childhood  . Hx of adenomatous polyp of colon 12/02/2014    Past Surgical History:  Procedure Laterality Date  . COLONOSCOPY  2004   in [place];pt doesn't know MD name.  Marland Kitchen HERNIA REPAIR Bilateral   . NOSE SURGERY     x2    Family History  Adopted: Yes    Social History   Tobacco Use  . Smoking status: Current Some Day Smoker    Packs/day: 1.00    Years: 38.00    Pack years: 38.00    Types: Cigarettes  . Smokeless tobacco: Never Used  Substance Use Topics  . Alcohol use: Yes    Alcohol/week: 3.0 oz    Types: 5 Shots of liquor per week    Subjective:  2 week follow-up on recent start of Amlodipine for new onset hypertension; notes that he does think he is feeling better since starting medication; has some mild dry mouth but not problematic enough to want to stop the medication; has been working on making diet changes and trying to eat better; knows that he needs to cut back on his alcohol intake; admits that  stress level is very high and working on making changes in his life to help with this; does not want to take any type of anti-depressant/ anti-anxiety medication; will be having treadmill test with cardiology due to palpitations; notes that his blood pressure is usually better than what is seen here today- has been getting checked at his chiropractor and has been averaging 124/76-78;   Objective:  Vitals:   02/22/17 1456  BP: 124/88  Pulse: 73  Temp: 97.9 F (36.6 C)  TempSrc: Oral  SpO2: 98%  Weight: 161 lb (73 kg)  Height: 5\' 11"  (1.803 m)    General: Well developed, well nourished, in no acute distress  Skin : Warm and dry.  Head: Normocephalic and atraumatic  Lungs: Respirations unlabored; clear to auscultation bilaterally without wheeze, rales, rhonchi  CVS exam: normal rate and regular rhythm.  Neurologic: Alert and oriented; speech intact; face symmetrical; moves all extremities well; CNII-XII intact without focal deficit  Assessment:  1. Elevated blood pressure reading     Plan:  New onset HTN; appears to be responding well to Amlodipine; ? Underlying white coat component as well; he will check his blood pressure 2-3 x per week and bring readings for review; for now continue Amlodipine 5 mg; congratulated patient on making diet changes and being committed to taking better care of his  health.   Return in about 4 weeks (around 03/22/2017) for with Charlsie Quest.  No orders of the defined types were placed in this encounter.   Requested Prescriptions    No prescriptions requested or ordered in this encounter

## 2017-03-01 ENCOUNTER — Ambulatory Visit (HOSPITAL_COMMUNITY)
Admission: RE | Admit: 2017-03-01 | Payer: BLUE CROSS/BLUE SHIELD | Source: Ambulatory Visit | Attending: Cardiology | Admitting: Cardiology

## 2017-03-03 ENCOUNTER — Telehealth (HOSPITAL_COMMUNITY): Payer: Self-pay

## 2017-03-03 NOTE — Telephone Encounter (Signed)
Encounter complete. 

## 2017-03-07 ENCOUNTER — Telehealth (HOSPITAL_COMMUNITY): Payer: Self-pay | Admitting: *Deleted

## 2017-03-07 ENCOUNTER — Ambulatory Visit (HOSPITAL_COMMUNITY)
Admission: RE | Admit: 2017-03-07 | Payer: BLUE CROSS/BLUE SHIELD | Source: Ambulatory Visit | Attending: Cardiology | Admitting: Cardiology

## 2017-03-07 NOTE — Telephone Encounter (Signed)
Close encounter 

## 2017-03-08 ENCOUNTER — Encounter: Payer: Self-pay | Admitting: Nurse Practitioner

## 2017-03-08 ENCOUNTER — Ambulatory Visit: Payer: BLUE CROSS/BLUE SHIELD | Admitting: Nurse Practitioner

## 2017-03-08 VITALS — BP 142/96 | HR 97 | Temp 101.0°F | Resp 16 | Ht 71.0 in | Wt 162.0 lb

## 2017-03-08 DIAGNOSIS — J111 Influenza due to unidentified influenza virus with other respiratory manifestations: Secondary | ICD-10-CM

## 2017-03-08 DIAGNOSIS — R509 Fever, unspecified: Secondary | ICD-10-CM

## 2017-03-08 LAB — POCT INFLUENZA A/B
INFLUENZA B, POC: POSITIVE — AB
Influenza A, POC: POSITIVE — AB

## 2017-03-08 MED ORDER — OSELTAMIVIR PHOSPHATE 75 MG PO CAPS: 75.0000 mg | ORAL_CAPSULE | Freq: Two times a day (BID) | ORAL | 0 refills | Status: DC

## 2017-03-08 NOTE — Patient Instructions (Addendum)
You have the flu.  I have sent a prescription to your pharmacy for tamiflu 75 mg twice daily for 5 days for the flu. In addition, you may take: Ibuprofen 600 mg every 6 hours as needed for pain, discomfort or fever OR Tylenol 1 gram every 8 hours- DO NOT TAKE MORE THAN 3 GRAMS OF TYLENOL IN A 24 HOUR PERIOD. You can alternate tylenol and ibuprofen as neeed. Rest, Drink plenty of fluids and stay well-hydrated.  Please remain home until Monday 1/14.  Please follow up for  fevers over 101 that are not reduced with tylenol, cough with colored sputum, shortness of breath, if you can not eat or drink, or if your symptoms get worse.   Fever, Adult A fever is an increase in the body's temperature. It is often defined as a temperature of 100 F (38C) or higher. Short mild or moderate fevers often have no long-term effects. They also often do not need treatment. Moderate or high fevers may make you feel uncomfortable. Sometimes, they can also be a sign of a serious illness or disease. The sweating that may happen with repeated fevers or fevers that last a while may also cause you to not have enough fluid in your body (dehydration). You can take your temperature with a thermometer to see if you have a fever. A measured temperature can change with:  Age.  Time of day.  Where the thermometer is placed: ? Mouth (oral). ? Rectum (rectal). ? Ear (tympanic). ? Underarm (axillary). ? Forehead (temporal).  Follow these instructions at home: Pay attention to any changes in your symptoms. Take these actions to help with your condition:  Take over-the-counter and prescription medicines only as told by your doctor. Follow the dosing instructions carefully.  If you were prescribed an antibiotic medicine, take it as told by your doctor. Do not stop taking the antibiotic even if you start to feel better.  Rest as needed.  Drink enough fluid to keep your pee (urine) clear or pale  yellow.  Sponge yourself or bathe with room-temperature water as needed. This helps to lower your body temperature . Do not use ice water.  Do not wear too many blankets or heavy clothes.  Contact a doctor if:  You throw up (vomit).  You cannot eat or drink without throwing up.  You have watery poop (diarrhea).  It hurts when you pee.  Your symptoms do not get better with treatment.  You have new symptoms.  You feel very weak. Get help right away if:  You are short of breath or have trouble breathing.  You are dizzy or you pass out (faint).  You feel confused.  You have signs of not having enough fluid in your body, such as: ? A dry mouth. ? Peeing less. ? Looking pale.  You have very bad pain in your belly (abdomen).  You keep throwing up or having water poop.  You have a skin rash.  Your symptoms suddenly get worse. This information is not intended to replace advice given to you by your health care provider. Make sure you discuss any questions you have with your health care provider. Document Released: 11/24/2007 Document Revised: 07/23/2015 Document Reviewed: 04/10/2014 Elsevier Interactive Patient Education  2018 [employer].  Influenza, Adult Influenza ("the flu") is an infection in the lungs, nose, and throat (respiratory tract). It is caused by a virus. The flu causes many common cold symptoms, as well as a  high fever and body aches. It can make you feel very sick. The flu spreads easily from person to person (is contagious). Getting a flu shot (influenza vaccination) every year is the best way to prevent the flu. Follow these instructions at home:  Take over-the-counter and prescription medicines only as told by your doctor.  Use a cool mist humidifier to add moisture (humidity) to the air in your home. This can make it easier to breathe.  Rest as needed.  Drink enough fluid to keep your pee (urine) clear or pale yellow.  Cover your mouth and nose  when you cough or sneeze.  Wash your hands with soap and water often, especially after you cough or sneeze. If you cannot use soap and water, use hand sanitizer.  Stay home from work or school as told by your doctor. Unless you are visiting your doctor, try to avoid leaving home until your fever has been gone for 24 hours without the use of medicine.  Keep all follow-up visits as told by your doctor. This is important. How is this prevented?  Getting a yearly (annual) flu shot is the best way to avoid getting the flu. You may get the flu shot in late summer, fall, or winter. Ask your doctor when you should get your flu shot.  Wash your hands often or use hand sanitizer often.  Avoid contact with people who are sick during cold and flu season.  Eat healthy foods.  Drink plenty of fluids.  Get enough sleep.  Exercise regularly. Contact a doctor if:  You get new symptoms.  You have: ? Chest pain. ? Watery poop (diarrhea). ? A fever.  Your cough gets worse.  You start to have more mucus.  You feel sick to your stomach (nauseous).  You throw up (vomit). Get help right away if:  You start to be short of breath or have trouble breathing.  Your skin or nails turn a bluish color.  You have very bad pain or stiffness in your neck.  You get a sudden headache.  You get sudden pain in your face or ear.  You cannot stop throwing up. This information is not intended to replace advice given to you by your health care provider. Make sure you discuss any questions you have with your health care provider. Document Released: 11/24/2007 Document Revised: 07/23/2015 Document Reviewed: 12/09/2014 Elsevier Interactive Patient Education  2017 [employer].

## 2017-03-08 NOTE — Progress Notes (Signed)
Subjective:    Patient ID: Roberto Jenkins, male    DOB: 08-25-1960, 57 y.o.   MRN: [id number]  HPI  Roberto Jenkins presents today for an acute complaint of cough/ cold symptoms. His symptoms began Monday. He reports fevers, chills, malaise, body aches, poor appetite, nasal congestion, sore throat, cough.  He denies weakness, shortness of breath, chest pain, nausea, vomiting. He has been taking tylenol with reduction of fever. He has been taking OTC cough medication with some relief of cough. He reports recent sick contacts at work.  Review of Systems  See HPI  Past Medical History:  Diagnosis Date  . Asthma   . Heart murmur    childhood  . Hx of adenomatous polyp of colon 12/02/2014     Social History   Socioeconomic History  . Marital status: Divorced    Spouse name: Not on file  . Number of children: 1  . Years of education: 66  . Highest education level: Not on file  Social Needs  . Financial resource strain: Not on file  . Food insecurity - worry: Not on file  . Food insecurity - inability: Not on file  . Transportation needs - medical: Not on file  . Transportation needs - non-medical: Not on file  Occupational History  . Occupation: Bartender  Tobacco Use  . Smoking status: Current Some Day Smoker    Packs/day: 1.00    Years: 38.00    Pack years: 38.00    Types: Cigarettes  . Smokeless tobacco: Never Used  Substance and Sexual Activity  . Alcohol use: Yes    Alcohol/week: 3.0 oz    Types: 5 Shots of liquor per week  . Drug use: Yes    Types: Amphetamines, LSD, Other-see comments    Comment: once every 3 months; occasionally, uses mushrooms.   . Sexual activity: Not on file  Other Topics Concern  . Not on file  Social History Narrative   Fun: Go to shows, occasionally workout.    Denies religious beliefs effecting health care.     Past Surgical History:  Procedure Laterality Date  . COLONOSCOPY  2004   in [place];pt doesn't know MD name.  Marland Kitchen  HERNIA REPAIR Bilateral   . NOSE SURGERY     x2    Family History  Adopted: Yes    No Known Allergies  Current Outpatient Medications on File Prior to Visit  Medication Sig Dispense Refill  . amLODipine (NORVASC) 5 MG tablet Take 1 tablet (5 mg total) by mouth daily. 90 tablet 3   No current facility-administered medications on file prior to visit.     BP (!) 142/96 (BP Location: Left Arm, Patient Position: Sitting, Cuff Size: Normal)   Pulse 97   Temp (!) 101 F (38.3 C) (Oral)   Resp 16   Ht 5\' 11"  (1.803 m)   Wt 162 lb (73.5 kg)   SpO2 95%   BMI 22.59 kg/m       Objective:   Physical Exam  Constitutional: He is oriented to person, place, and time. He appears well-developed and well-nourished. No distress.  HENT:  Head: Normocephalic and atraumatic.  Right Ear: Tympanic membrane, external ear and ear canal normal.  Left Ear: Tympanic membrane, external ear and ear canal normal.  Nose: Nose normal.  Mouth/Throat: Oropharynx is clear and moist and mucous membranes are normal.  Eyes: Conjunctivae are normal.  Neck: Normal range of motion. Neck supple.  Cardiovascular: Normal rate, regular rhythm,  normal heart sounds and intact distal pulses.  Pulmonary/Chest: Effort normal. No respiratory distress.  Lymphadenopathy:    He has no cervical adenopathy.  Neurological: He is alert and oriented to person, place, and time. Coordination normal.  Skin: Skin is warm and dry.  Psychiatric: He has a normal mood and affect. Judgment and thought content normal.      Assessment & Plan:  Fever, unspecified fever cause Diagnostic testing ordered: - POCT Influenza A/B  Influenza Positive flu test today. Stressed importance of resting, remaining hydrated, and remaining home from work for the rest of this week to recover. Instructions given for fever treatment at home Medications ordered: - oseltamivir (TAMIFLU) 75 MG capsule; Take 1 capsule (75 mg total) by mouth 2 (two)  times daily.  Dispense: 10 capsule; Refill: 0 Strict return precautions given. Pt education handout given.

## 2017-03-09 ENCOUNTER — Telehealth (HOSPITAL_COMMUNITY): Payer: Self-pay

## 2017-03-09 ENCOUNTER — Ambulatory Visit: Payer: BLUE CROSS/BLUE SHIELD | Admitting: Internal Medicine

## 2017-03-09 NOTE — Telephone Encounter (Signed)
Encounter complete. 

## 2017-03-14 ENCOUNTER — Ambulatory Visit (HOSPITAL_COMMUNITY)
Admission: RE | Admit: 2017-03-14 | Discharge: 2017-03-14 | Disposition: A | Discharge status: Home / Self Care | Payer: BLUE CROSS/BLUE SHIELD | Source: Ambulatory Visit | Attending: Cardiovascular Disease | Admitting: Cardiovascular Disease

## 2017-03-14 DIAGNOSIS — R079 Chest pain, unspecified: Secondary | ICD-10-CM | POA: Diagnosis present

## 2017-03-14 LAB — EXERCISE TOLERANCE TEST
CHL CUP MPHR: 164 {beats}/min
CSEPED: 10 min
CSEPEDS: 29 s
CSEPEW: 12.7 METS
CSEPHR: 95 %
CSEPPHR: 155 {beats}/min
RPE: 18
Rest HR: 64 {beats}/min

## 2017-03-16 ENCOUNTER — Ambulatory Visit: Payer: BLUE CROSS/BLUE SHIELD | Admitting: Internal Medicine

## 2017-03-28 ENCOUNTER — Ambulatory Visit: Payer: BLUE CROSS/BLUE SHIELD | Admitting: Nurse Practitioner

## 2017-03-28 ENCOUNTER — Other Ambulatory Visit (INDEPENDENT_AMBULATORY_CARE_PROVIDER_SITE_OTHER): Payer: BLUE CROSS/BLUE SHIELD

## 2017-03-28 ENCOUNTER — Encounter: Payer: Self-pay | Admitting: Nurse Practitioner

## 2017-03-28 VITALS — BP 118/80 | HR 75 | Temp 98.4°F | Resp 16 | Ht 71.0 in | Wt 162.0 lb

## 2017-03-28 DIAGNOSIS — R899 Unspecified abnormal finding in specimens from other organs, systems and tissues: Secondary | ICD-10-CM

## 2017-03-28 DIAGNOSIS — K760 Fatty (change of) liver, not elsewhere classified: Secondary | ICD-10-CM | POA: Diagnosis not present

## 2017-03-28 DIAGNOSIS — I10 Essential (primary) hypertension: Secondary | ICD-10-CM | POA: Diagnosis not present

## 2017-03-28 LAB — CBC WITH DIFFERENTIAL/PLATELET
Basophils Absolute: 0.1 10*3/uL (ref 0.0–0.1)
Basophils Relative: 1.3 % (ref 0.0–3.0)
EOS ABS: 0.2 10*3/uL (ref 0.0–0.7)
Eosinophils Relative: 4.1 % (ref 0.0–5.0)
HCT: 44.2 % (ref 39.0–52.0)
HEMOGLOBIN: 14.8 g/dL (ref 13.0–17.0)
LYMPHS ABS: 2.2 10*3/uL (ref 0.7–4.0)
Lymphocytes Relative: 37.8 % (ref 12.0–46.0)
MCHC: 33.5 g/dL (ref 30.0–36.0)
MCV: 92.7 fl (ref 78.0–100.0)
MONO ABS: 0.6 10*3/uL (ref 0.1–1.0)
Monocytes Relative: 10.8 % (ref 3.0–12.0)
Neutro Abs: 2.7 10*3/uL (ref 1.4–7.7)
Neutrophils Relative %: 46 % (ref 43.0–77.0)
Platelets: 225 10*3/uL (ref 150.0–400.0)
RBC: 4.76 Mil/uL (ref 4.22–5.81)
RDW: 12.7 % (ref 11.5–15.5)
WBC: 5.8 10*3/uL (ref 4.0–10.5)

## 2017-03-28 LAB — COMPREHENSIVE METABOLIC PANEL
ALBUMIN: 4.3 g/dL (ref 3.5–5.2)
ALT: 12 U/L (ref 0–53)
AST: 11 U/L (ref 0–37)
Alkaline Phosphatase: 55 U/L (ref 39–117)
BUN: 11 mg/dL (ref 6–23)
CHLORIDE: 105 meq/L (ref 96–112)
CO2: 32 mEq/L (ref 19–32)
Calcium: 9.1 mg/dL (ref 8.4–10.5)
Creatinine, Ser: 0.68 mg/dL (ref 0.40–1.50)
GFR: 127.73 mL/min (ref >60.00)
GLUCOSE: 80 mg/dL (ref 70–99)
Potassium: 3.8 mEq/L (ref 3.5–5.1)
SODIUM: 142 meq/L (ref 135–145)
TOTAL PROTEIN: 6.6 g/dL (ref 6.0–8.3)
Total Bilirubin: 0.8 mg/dL (ref 0.2–1.2)

## 2017-03-28 NOTE — Assessment & Plan Note (Signed)
Stable, will continue amlodipine at current dosage. He was encourage to continue routine monitoring of BP at home and work on healthy diet and exercise- See AVS for education provided to patient

## 2017-03-28 NOTE — Patient Instructions (Signed)
Please head downstairs for lab work.  Great job on eating better and cutting down on your alcohol! Keep up the good work!!  I'd like to see you back in about 3 months, or sooner if you need me.  It was so good to see you. Thanks for letting me take care of you today :)   Mediterranean Diet A Mediterranean diet refers to food and lifestyle choices that are based on the traditions of countries located on the [employer]. This way of eating has been shown to help prevent certain conditions and improve outcomes for people who have chronic diseases, like kidney disease and heart disease. What are tips for following this plan? Lifestyle  Cook and eat meals together with your family, when possible.  Drink enough fluid to keep your urine clear or pale yellow.  Be physically active every day. This includes: ? Aerobic exercise like running or swimming. ? Leisure activities like gardening, walking, or housework.  Get 7-8 hours of sleep each night.  If recommended by your health care provider, drink red wine in moderation. This means 1 glass a day for nonpregnant women and 2 glasses a day for men. A glass of wine equals 5 oz (150 mL). Reading food labels  Check the serving size of packaged foods. For foods such as rice and pasta, the serving size refers to the amount of cooked product, not dry.  Check the total fat in packaged foods. Avoid foods that have saturated fat or trans fats.  Check the ingredients list for added sugars, such as corn syrup. Shopping  At the grocery store, buy most of your food from the areas near the walls of the store. This includes: ? Fresh fruits and vegetables (produce). ? Grains, beans, nuts, and seeds. Some of these may be available in unpackaged forms or large amounts (in bulk). ? Fresh seafood. ? Poultry and eggs. ? Low-fat dairy products.  Buy whole ingredients instead of prepackaged foods.  Buy fresh fruits and vegetables in-season from local  farmers markets.  Buy frozen fruits and vegetables in resealable bags.  If you do not have access to quality fresh seafood, buy precooked frozen shrimp or canned fish, such as tuna, salmon, or sardines.  Buy small amounts of raw or cooked vegetables, salads, or olives from the deli or salad bar at your store.  Stock your pantry so you always have certain foods on hand, such as olive oil, canned tuna, canned tomatoes, rice, pasta, and beans. Cooking  Cook foods with extra-virgin olive oil instead of using butter or other vegetable oils.  Have meat as a side dish, and have vegetables or grains as your main dish. This means having meat in small portions or adding small amounts of meat to foods like pasta or stew.  Use beans or vegetables instead of meat in common dishes like chili or lasagna.  Experiment with different cooking methods. Try roasting or broiling vegetables instead of steaming or sauteing them.  Add frozen vegetables to soups, stews, pasta, or rice.  Add nuts or seeds for added healthy fat at each meal. You can add these to yogurt, salads, or vegetable dishes.  Marinate fish or vegetables using olive oil, lemon juice, garlic, and fresh herbs. Meal planning  Plan to eat 1 vegetarian meal one day each week. Try to work up to 2 vegetarian meals, if possible.  Eat seafood 2 or more times a week.  Have healthy snacks readily available, such as: ? Vegetable sticks with  hummus. ? [place] yogurt. ? Fruit and nut trail mix.  Eat balanced meals throughout the week. This includes: ? Fruit: 2-3 servings a day ? Vegetables: 4-5 servings a day ? Low-fat dairy: 2 servings a day ? Fish, poultry, or lean meat: 1 serving a day ? Beans and legumes: 2 or more servings a week ? Nuts and seeds: 1-2 servings a day ? Whole grains: 6-8 servings a day ? Extra-virgin olive oil: 3-4 servings a day  Limit red meat and sweets to only a few servings a month What are my food  choices?  Mediterranean diet ? Recommended ? Grains: Whole-grain pasta. Brown rice. Bulgar wheat. Polenta. Couscous. Whole-wheat bread. [place]. ? Vegetables: Artichokes. Beets. Broccoli. Cabbage. Carrots. Eggplant. Green beans. Chard. Kale. Spinach. Onions. Leeks. Peas. Squash. Tomatoes. Peppers. Radishes. ? Fruits: Apples. Apricots. Avocado. Berries. Bananas. Cherries. Dates. Figs. Grapes. Lemons. Melon. Oranges. Peaches. Plums. Pomegranate. ? Meats and other protein foods: Beans. Almonds. Sunflower seeds. Pine nuts. Peanuts. [place]. Salmon. Scallops. Shrimp. [place]. Tilapia. Clams. Oysters. Eggs. ? Dairy: Low-fat milk. Cheese. Greek yogurt. ? Beverages: Water. Red wine. Herbal tea. ? Fats and oils: Extra virgin olive oil. Avocado oil. Grape seed oil. ? Sweets and desserts: [place] yogurt with honey. Baked apples. Poached pears. Trail mix. ? Seasoning and other foods: Basil. Cilantro. Coriander. Cumin. Mint. Parsley. Sage. Rosemary. Tarragon. Garlic. Oregano. Thyme. Pepper. Balsalmic vinegar. Tahini. Hummus. Tomato sauce. Olives. Mushrooms. ? Limit these ? Grains: Prepackaged pasta or rice dishes. Prepackaged cereal with added sugar. ? Vegetables: Deep fried potatoes (french fries). ? Fruits: Fruit canned in syrup. ? Meats and other protein foods: Beef. Pork. Lamb. Poultry with skin. Hot dogs. [place] Salines. ? Dairy: Ice cream. Sour cream. Whole milk. ? Beverages: Juice. Sugar-sweetened soft drinks. Beer. Liquor and spirits. ? Fats and oils: Butter. Canola oil. Vegetable oil. Beef fat (tallow). Lard. ? Sweets and desserts: Cookies. Cakes. Pies. Candy. ? Seasoning and other foods: Mayonnaise. Premade sauces and marinades. ? The items listed may not be a complete list. Talk with your dietitian about what dietary choices are right for you. Summary  The Mediterranean diet includes both food and lifestyle choices.  Eat a variety of fresh fruits and vegetables, beans, nuts, seeds, and whole  grains.  Limit the amount of red meat and sweets that you eat.  Talk with your health care provider about whether it is safe for you to drink red wine in moderation. This means 1 glass a day for nonpregnant women and 2 glasses a day for men. A glass of wine equals 5 oz (150 mL). This information is not intended to replace advice given to you by your health care provider. Make sure you discuss any questions you have with your health care provider. Document Released: 10/08/2015 Document Revised: 11/10/2015 Document Reviewed: 10/08/2015 Elsevier Interactive Patient Education  [employer].

## 2017-03-28 NOTE — Assessment & Plan Note (Signed)
Will repeat LFTs for trend today- he was referred to wrong liver specialist accidentally- we discussed holding referral if liver enzymes are trending down. He was encouraged to continue decreasing his alcohol intake. He does seem pretty motivated to improve his health, and I think that his abnormal labwork on his physical this year really helped him make this decision.. - Comprehesive metabolic panel; Future

## 2017-03-28 NOTE — Progress Notes (Signed)
Name: Roberto Jenkins   MRN: [id number]    DOB: 11-22-1960   Date:03/28/2017       Progress Note  Subjective  Chief Complaint  Chief Complaint  Patient presents with  . Follow-up    BP    HPI He is here today for follow up of hypertension and elevated liver enzymes.  Hypertension -maintained on amlodipine 5 daily, which was started on 12/12 after multiple elevated blood pressure readings. He returned for follow up of blood pressure on 12/26 after initiation of amlodipine with a normal reading. He then had an elevated reading in the office on 1/9 but was sick with flu. Today he returns for repeated blood pressure follow up. He says he has been taking the amlodipine 5 daily with no adverse medication effects. Reports he has been checking occasionally at home - last recorded reading was 128/76 Denies headaches, vision changes, chest pain, shortness of breath, edema.  BP Readings from Last 3 Encounters:  03/28/17 118/80  03/08/17 (!) 142/96  02/22/17 124/88   Elevated liver function tests- This was discovered on labs for CPE on 12/12. He was instructed to decrease alcohol intake and an abdominal ultrasound was ordered which showed Increased echotexture throughout the liver compatible with fatty infiltration. He says that since he found out his labwork was abnormal, he has been actively working on eating a healthier diet that includes three meals a day and decreased his alcohol intake by 1/2-from about 10 down to 5 drinks a day. He said he did not really realize how much he was drinking until his labs were abnormal. He denies any fevers, dizziness, abdominal pain, nausea, vomiting, pallor, jaundice, rashes.     Patient Active Problem List   Diagnosis Date Noted  . Encounter for general adult medical examination with abnormal findings 02/08/2017  . Elevated blood pressure reading 02/08/2017  . Alcohol consumption heavy 02/08/2017  . Erectile dysfunction 02/05/2015  . Hx of  adenomatous polyp of colon 12/02/2014  . Rash 09/27/2014  . Hemorrhoid 09/26/2014  . Low back pain 09/26/2014  . DEPRESSION 08/29/2007  . CONTACT DERMATITIS DUE TO POISON IVY 08/29/2007  . PERSONAL HISTORY OTHER DISORDER URINARY SYSTEM 08/29/2007    Past Surgical History:  Procedure Laterality Date  . COLONOSCOPY  2004   in [place];pt doesn't know MD name.  Marland Kitchen HERNIA REPAIR Bilateral   . NOSE SURGERY     x2    Family History  Adopted: Yes    Social History   Socioeconomic History  . Marital status: Divorced    Spouse name: Not on file  . Number of children: 1  . Years of education: 10  . Highest education level: Not on file  Social Needs  . Financial resource strain: Not on file  . Food insecurity - worry: Not on file  . Food insecurity - inability: Not on file  . Transportation needs - medical: Not on file  . Transportation needs - non-medical: Not on file  Occupational History  . Occupation: Bartender  Tobacco Use  . Smoking status: Current Some Day Smoker    Packs/day: 1.00    Years: 38.00    Pack years: 38.00    Types: Cigarettes  . Smokeless tobacco: Never Used  Substance and Sexual Activity  . Alcohol use: Yes    Alcohol/week: 3.0 oz    Types: 5 Shots of liquor per week  . Drug use: Yes    Types: Amphetamines, LSD, Other-see comments    Comment:  once every 3 months; occasionally, uses mushrooms.   . Sexual activity: Not on file  Other Topics Concern  . Not on file  Social History Narrative   Fun: Go to shows, occasionally workout.    Denies religious beliefs effecting health care.      Current Outpatient Medications:  .  amLODipine (NORVASC) 5 MG tablet, Take 1 tablet (5 mg total) by mouth daily., Disp: 90 tablet, Rfl: 3 .  oseltamivir (TAMIFLU) 75 MG capsule, Take 1 capsule (75 mg total) by mouth 2 (two) times daily. (Patient not taking: Reported on 03/28/2017), Disp: 10 capsule, Rfl: 0  No Known Allergies   ROS See  HPI  Objective  Vitals:   03/28/17 1223  BP: 118/80  Pulse: 75  Resp: 16  Temp: 98.4 F (36.9 C)  TempSrc: Oral  SpO2: 97%  Weight: 162 lb (73.5 kg)  Height: 5\' 11"  (1.803 m)    Body mass index is 22.59 kg/m.  Physical Exam Constitutional: Patient appears well-developed and well-nourished. No distress.  HENT: Head: Normocephalic and atraumatic. Nose: Nose normal. Mouth/Throat: Oropharynx is clear and moist. No oropharyngeal exudate.  Eyes: Conjunctivae are normal. No scleral icterus.  Neck: Normal range of motion. Neck supple. Cardiovascular: Normal rate, regular rhythm and normal heart sounds.  No BLE edema. Distal pulses intact. Pulmonary/Chest: Effort normal and breath sounds normal. No respiratory distress. Abdominal: Soft. Bowel sounds are normal, no distension. There is no tenderness. no masses Musculoskeletal: Normal range of motion, no joint effusions. No gross deformities Neurological: He is alert and oriented to person, place, and time.  Coordination, balance, strength, speech and gait are normal.  Skin: Skin is warm and dry. No rash noted. No erythema.  Psychiatric: Patient has a normal mood and affect. Behavior is normal. Judgment and thought content normal.  Assessment & Plan RTC in 3 months for routine F/u of HTN, chronic conditions  -Reviewed Health Maintenance: up to date  Abnormal laboratory test His CBC was not drawn on CPE so will collect today - CBC with Differential/Platelet; Future   3. Fatty liver

## 2017-03-29 ENCOUNTER — Encounter: Payer: Self-pay | Admitting: Nurse Practitioner

## 2017-04-26 ENCOUNTER — Encounter: Payer: Self-pay | Admitting: Nurse Practitioner

## 2017-05-01 NOTE — Progress Notes (Signed)
Subjective:    Patient ID: Roberto Jenkins, male    DOB: [birth year], 57 y.o.   MRN: [id number]  HPI He is here for an acute visit for cold symptoms.  He had the flu in January.   His symptoms started > one week ago.  He is experiencing decreased appetite, fever, chills, nasal congestion ear pain, hearing loss, postnasal drip, sore throat, productive cough of discolored sputum, shortness of breath, wheezing, nausea and lightheadedness.  He has not noted any sinus pain, headaches or diarrhea.  He does smoke intermittently.  He has taken delsym, mucinex  Medications and allergies reviewed with patient and updated if appropriate.  Patient Active Problem List   Diagnosis Date Noted  . Fatty liver 03/28/2017  . Encounter for general adult medical examination with abnormal findings 02/08/2017  . Hypertension 02/08/2017  . Alcohol consumption heavy 02/08/2017  . Erectile dysfunction 02/05/2015  . Hx of adenomatous polyp of colon 12/02/2014  . Rash 09/27/2014  . Hemorrhoid 09/26/2014  . Low back pain 09/26/2014  . DEPRESSION 08/29/2007  . CONTACT DERMATITIS DUE TO POISON IVY 08/29/2007  . PERSONAL HISTORY OTHER DISORDER URINARY SYSTEM 08/29/2007    Current Outpatient Medications on File Prior to Visit  Medication Sig Dispense Refill  . amLODipine (NORVASC) 5 MG tablet Take 1 tablet (5 mg total) by mouth daily. 90 tablet 3   No current facility-administered medications on file prior to visit.     Past Medical History:  Diagnosis Date  . Asthma   . Heart murmur    childhood  . Hx of adenomatous polyp of colon 12/02/2014    Past Surgical History:  Procedure Laterality Date  . COLONOSCOPY  2004   in [place];pt doesn't know MD name.  Marland Kitchen HERNIA REPAIR Bilateral   . NOSE SURGERY     x2    Social History   Socioeconomic History  . Marital status: Divorced    Spouse name: None  . Number of children: 1  . Years of education: 69  . Highest education level: None    Social Needs  . Financial resource strain: None  . Food insecurity - worry: None  . Food insecurity - inability: None  . Transportation needs - medical: None  . Transportation needs - non-medical: None  Occupational History  . Occupation: Bartender  Tobacco Use  . Smoking status: Current Some Day Smoker    Packs/day: 1.00    Years: 38.00    Pack years: 38.00    Types: Cigarettes  . Smokeless tobacco: Never Used  Substance and Sexual Activity  . Alcohol use: Yes    Alcohol/week: 3.0 oz    Types: 5 Shots of liquor per week  . Drug use: Yes    Types: Amphetamines, LSD, Other-see comments    Comment: once every 3 months; occasionally, uses mushrooms.   . Sexual activity: None  Other Topics Concern  . None  Social History Narrative   Fun: Go to shows, occasionally workout.    Denies religious beliefs effecting health care.     Family History  Adopted: Yes    Review of Systems  Constitutional: Positive for appetite change (none), chills and fever.  HENT: Positive for congestion, ear pain, hearing loss, postnasal drip and sore throat (intermittent from drainage). Negative for sinus pain.   Respiratory: Positive for cough (productive - yellow- green mucus), shortness of breath and wheezing.   Gastrointestinal: Positive for nausea (occ).  Neurological: Positive for light-headedness. Negative for headaches.  Objective:   Vitals:   05/02/17 1446  BP: 124/74  Pulse: 84  Resp: 16  Temp: 98.3 F (36.8 C)  SpO2: 96%   Filed Weights   05/02/17 1446  Weight: 162 lb (73.5 kg)   Body mass index is 22.59 kg/m.  Wt Readings from Last 3 Encounters:  05/02/17 162 lb (73.5 kg)  03/28/17 162 lb (73.5 kg)  03/08/17 162 lb (73.5 kg)     Physical Exam GENERAL APPEARANCE: Appears stated age, well appearing, NAD EYES: conjunctiva clear, no icterus HEENT: bilateral tympanic membranes erythematous and nonbulging, bilateral ear canals normal, oropharynx with moderate  erythema, no thyromegaly, trachea midline, no cervical or supraclavicular lymphadenopathy LUNGS: unlabored breathing, good air entry bilaterally, diffuse rhonchi throughout lungs with expiration CARDIOVASCULAR: Normal S1,S2 without murmurs, no edema SKIN: warm, dry        Assessment & Plan:   See Problem List for Assessment and Plan of chronic medical problems.

## 2017-05-02 ENCOUNTER — Ambulatory Visit: Payer: BLUE CROSS/BLUE SHIELD | Admitting: Internal Medicine

## 2017-05-02 ENCOUNTER — Encounter: Payer: Self-pay | Admitting: Internal Medicine

## 2017-05-02 VITALS — BP 124/74 | HR 84 | Temp 98.3°F | Resp 16 | Wt 162.0 lb

## 2017-05-02 DIAGNOSIS — R062 Wheezing: Secondary | ICD-10-CM

## 2017-05-02 DIAGNOSIS — J209 Acute bronchitis, unspecified: Secondary | ICD-10-CM | POA: Diagnosis not present

## 2017-05-02 MED ORDER — CEFDINIR 300 MG PO CAPS: 300.0000 mg | ORAL_CAPSULE | Freq: Two times a day (BID) | ORAL | 0 refills | Status: DC

## 2017-05-02 MED ORDER — PREDNISONE 10 MG PO TABS: ORAL_TABLET | ORAL | 0 refills | Status: DC

## 2017-05-02 MED ORDER — METHYLPREDNISOLONE ACETATE 80 MG/ML IJ SUSP
80.0000 mg | Freq: Once | INTRAMUSCULAR | Status: AC
  Administered 2017-05-02: 80 mg via INTRAMUSCULAR

## 2017-05-02 NOTE — Patient Instructions (Signed)
Take the antibiotic and steroid as proscribed.     You had a steroid injection today.   Continue the over the counter cold medications.  Call if no improvement

## 2017-05-02 NOTE — Assessment & Plan Note (Signed)
Likely bacterial in nature Start Omnicef twice daily times 10 days Continue Delsym and Mucinex Encourage smoking cessation Rest, fluids Call if no improvement

## 2017-05-02 NOTE — Assessment & Plan Note (Signed)
Diffuse expiratory wheezing/rhonchi and exam Likely reactive airway disease secondary to bacterial bronchitis Depo-Medrol 80 mg IM x1 Steroid taper starting tomorrow Over-the-counter cold medications Rest, fluids Call if no improvement

## 2017-05-10 ENCOUNTER — Other Ambulatory Visit: Payer: Self-pay | Admitting: Internal Medicine

## 2017-05-10 DIAGNOSIS — H9193 Unspecified hearing loss, bilateral: Secondary | ICD-10-CM

## 2017-05-31 ENCOUNTER — Ambulatory Visit: Payer: BLUE CROSS/BLUE SHIELD | Admitting: Family

## 2017-05-31 ENCOUNTER — Encounter: Payer: Self-pay | Admitting: Family

## 2017-05-31 ENCOUNTER — Ambulatory Visit (INDEPENDENT_AMBULATORY_CARE_PROVIDER_SITE_OTHER)
Admission: RE | Admit: 2017-05-31 | Discharge: 2017-05-31 | Disposition: A | Discharge status: Home / Self Care | Payer: BLUE CROSS/BLUE SHIELD | Source: Ambulatory Visit | Attending: Family | Admitting: Family

## 2017-05-31 VITALS — BP 122/74 | HR 85 | Temp 98.6°F | Ht 71.0 in | Wt 164.0 lb

## 2017-05-31 DIAGNOSIS — R058 Other specified cough: Secondary | ICD-10-CM

## 2017-05-31 DIAGNOSIS — H6981 Other specified disorders of Eustachian tube, right ear: Secondary | ICD-10-CM | POA: Diagnosis not present

## 2017-05-31 DIAGNOSIS — R05 Cough: Secondary | ICD-10-CM

## 2017-05-31 DIAGNOSIS — J209 Acute bronchitis, unspecified: Secondary | ICD-10-CM

## 2017-05-31 MED ORDER — LEVOFLOXACIN 500 MG PO TABS: 500.0000 mg | ORAL_TABLET | Freq: Every day | ORAL | 0 refills | Status: DC

## 2017-05-31 MED ORDER — BUDESONIDE-FORMOTEROL FUMARATE 160-4.5 MCG/ACT IN AERO: 2.0000 | INHALATION_SPRAY | Freq: Two times a day (BID) | RESPIRATORY_TRACT | 1 refills | Status: DC

## 2017-05-31 MED ORDER — TRIAMCINOLONE ACETONIDE 55 MCG/ACT NA AERO: 2.0000 | INHALATION_SPRAY | Freq: Every day | NASAL | 6 refills | Status: AC

## 2017-05-31 NOTE — Progress Notes (Signed)
Roberto Jenkins is a 57 y.o. male with the following history as recorded in [facility]:  Patient Active Problem List   Diagnosis Date Noted  . Acute bronchitis 05/02/2017  . Wheezing 05/02/2017  . Fatty liver 03/28/2017  . Encounter for general adult medical examination with abnormal findings 02/08/2017  . Hypertension 02/08/2017  . Alcohol consumption heavy 02/08/2017  . Erectile dysfunction 02/05/2015  . Hx of adenomatous polyp of colon 12/02/2014  . Rash 09/27/2014  . Hemorrhoid 09/26/2014  . Low back pain 09/26/2014  . DEPRESSION 08/29/2007  . CONTACT DERMATITIS DUE TO POISON IVY 08/29/2007  . PERSONAL HISTORY OTHER DISORDER URINARY SYSTEM 08/29/2007    Current Outpatient Medications  Medication Sig Dispense Refill  . amLODipine (NORVASC) 5 MG tablet Take 1 tablet (5 mg total) by mouth daily. 90 tablet 3  . fluticasone (FLONASE) 50 MCG/ACT nasal spray Place into the nose.    . budesonide-formoterol (SYMBICORT) 160-4.5 MCG/ACT inhaler Inhale 2 puffs into the lungs 2 (two) times daily. 1 Inhaler 1  . levofloxacin (LEVAQUIN) 500 MG tablet Take 1 tablet (500 mg total) by mouth daily. 10 tablet 0  . triamcinolone (NASACORT) 55 MCG/ACT AERO nasal inhaler Place 2 sprays into the nose daily. 1 Inhaler 6  . valACYclovir (VALTREX) 1000 MG tablet valacyclovir 1 gram tablet     No current facility-administered medications for this visit.     Allergies: Patient has no known allergies.  Past Medical History:  Diagnosis Date  . Asthma   . Heart murmur    childhood  . Hx of adenomatous polyp of colon 12/02/2014    Past Surgical History:  Procedure Laterality Date  . COLONOSCOPY  2004   in [place];pt doesn't know MD name.  Marland Kitchen HERNIA REPAIR Bilateral   . NOSE SURGERY     x2    Family History  Adopted: Yes    Social History   Tobacco Use  . Smoking status: Current Some Day Smoker    Packs/day: 1.00    Years: 38.00    Pack years: 38.00    Types: Cigarettes  . Smokeless  tobacco: Never Used  Substance Use Topics  . Alcohol use: Yes    Alcohol/week: 3.0 oz    Types: 5 Shots of liquor per week    Subjective:  Patient was seen early in March with wheezing/ bronchitis; was treated with 10 days of Omnicef and oral steroids; does not feel that he ever cleared his infection completely; continuing to cough/ run low grade fevers at night; does have asthma/ allergy; does smoke occasionally; has not been able to tolerate Flonase for extended period of time due to nose bleeds; feels like right ear is very full and clogged;    Objective:  Vitals:   05/31/17 0922  BP: 122/74  Pulse: 85  Temp: 98.6 F (37 C)  TempSrc: Oral  SpO2: 96%  Weight: 164 lb (74.4 kg)  Height: 5\' 11"  (1.803 m)    General: Well developed, well nourished, in no acute distress  Skin : Warm and dry.  Head: Normocephalic and atraumatic  Eyes: Sclera and conjunctiva clear; pupils round and reactive to light; extraocular movements intact  Ears: External normal; canals clear; tympanic membranes congested bilaterally Oropharynx: Pink, supple. No suspicious lesions  Neck: Supple without thyromegaly, adenopathy  Lungs: Respirations unlabored; wheezing noted in all 4 lobes- improvement after albuterol treatment given in office;   CVS exam: normal rate and regular rhythm.  Neurologic: Alert and oriented; speech intact; face  symmetrical; moves all extremities well; CNII-XII intact without focal deficit  Assessment:  1. Cough present for greater than 3 weeks   2. Acute bronchitis, unspecified organism   3. Dysfunction of right eustachian tube     Plan:  Update CXR today; Rx for Levaquin 500 mg qd x 10 days; trial of Symbicort 160/4.5 2 puffs bid x 1 month; trial of Nasacort AQ for allergy symptoms; increase fluids, rest and follow-up worse, no better.  If ear issue persists, will need to go back to his ENT and discuss possibility of tube placement.   No follow-ups on file.  Orders Placed This  Encounter  Procedures  . DG Chest 2 View    Standing Status:   Future    Number of Occurrences:   1    Standing Expiration Date:   08/01/2018    Order Specific Question:   Reason for Exam (SYMPTOM  OR DIAGNOSIS REQUIRED)    Answer:   cough x 1 month; wheezing; history of tobacco use    Order Specific Question:   Preferred imaging location?    Answer:   [facility]    Order Specific Question:   Radiology Contrast Protocol - do NOT remove file path    Answer:   [email or web address]    Requested Prescriptions   Signed Prescriptions Disp Refills  . levofloxacin (LEVAQUIN) 500 MG tablet 10 tablet 0    Sig: Take 1 tablet (500 mg total) by mouth daily.  Marland Kitchen triamcinolone (NASACORT) 55 MCG/ACT AERO nasal inhaler 1 Inhaler 6    Sig: Place 2 sprays into the nose daily.  . budesonide-formoterol (SYMBICORT) 160-4.5 MCG/ACT inhaler 1 Inhaler 1    Sig: Inhale 2 puffs into the lungs 2 (two) times daily.

## 2017-06-22 ENCOUNTER — Telehealth: Payer: Self-pay | Admitting: Nurse Practitioner

## 2017-06-22 ENCOUNTER — Encounter: Payer: Self-pay | Admitting: Nurse Practitioner

## 2017-06-22 ENCOUNTER — Ambulatory Visit: Payer: BLUE CROSS/BLUE SHIELD | Admitting: Nurse Practitioner

## 2017-06-22 ENCOUNTER — Other Ambulatory Visit (INDEPENDENT_AMBULATORY_CARE_PROVIDER_SITE_OTHER): Payer: BLUE CROSS/BLUE SHIELD

## 2017-06-22 VITALS — BP 134/82 | HR 74 | Temp 98.6°F | Resp 16 | Ht 71.0 in | Wt 165.0 lb

## 2017-06-22 DIAGNOSIS — K760 Fatty (change of) liver, not elsewhere classified: Secondary | ICD-10-CM | POA: Diagnosis not present

## 2017-06-22 DIAGNOSIS — I10 Essential (primary) hypertension: Secondary | ICD-10-CM | POA: Diagnosis not present

## 2017-06-22 DIAGNOSIS — R062 Wheezing: Secondary | ICD-10-CM | POA: Diagnosis not present

## 2017-06-22 DIAGNOSIS — J449 Chronic obstructive pulmonary disease, unspecified: Secondary | ICD-10-CM | POA: Diagnosis not present

## 2017-06-22 LAB — COMPREHENSIVE METABOLIC PANEL
ALK PHOS: 66 U/L (ref 39–117)
ALT: 13 U/L (ref 0–53)
AST: 16 U/L (ref 0–37)
Albumin: 4.2 g/dL (ref 3.5–5.2)
BILIRUBIN TOTAL: 0.4 mg/dL (ref 0.2–1.2)
BUN: 7 mg/dL (ref 6–23)
CO2: 29 mEq/L (ref 19–32)
Calcium: 9 mg/dL (ref 8.4–10.5)
Chloride: 104 mEq/L (ref 96–112)
Creatinine, Ser: 0.68 mg/dL (ref 0.40–1.50)
GFR: 127.62 mL/min (ref >60.00)
GLUCOSE: 95 mg/dL (ref 70–99)
Potassium: 4 mEq/L (ref 3.5–5.1)
Sodium: 139 mEq/L (ref 135–145)
TOTAL PROTEIN: 6.4 g/dL (ref 6.0–8.3)

## 2017-06-22 LAB — POCT EXHALED NITRIC OXIDE: FeNO level (ppb): 24

## 2017-06-22 MED ORDER — MONTELUKAST SODIUM 10 MG PO TABS: 10.0000 mg | ORAL_TABLET | Freq: Every day | ORAL | 1 refills | Status: DC

## 2017-06-22 MED ORDER — BUDESONIDE-FORMOTEROL FUMARATE 160-4.5 MCG/ACT IN AERO: 2.0000 | INHALATION_SPRAY | Freq: Two times a day (BID) | RESPIRATORY_TRACT | 1 refills | Status: DC

## 2017-06-22 NOTE — Assessment & Plan Note (Signed)
Continue symbicort Return and ED precautions including fevers over 101, shortness of breath, chest pain were discussed and printed in AVS. F/U in 2-3 weeks

## 2017-06-22 NOTE — Telephone Encounter (Signed)
Please have the patient continue his symbicort daily. I told him he could stop it today, but after reviewing his chest xray I see that it showed some COPD, which the symbicort will help with. Also, continue plan for allergy treatment as we dicussed during his visit today

## 2017-06-22 NOTE — Assessment & Plan Note (Signed)
Update CMET today, if stable will plan to continue annual CMET for monitoring  Encouraged to continue healthy diet and reduction of alcohol F/U for new, worsening symptoms - Comprehensive metabolic panel; Future

## 2017-06-22 NOTE — Progress Notes (Signed)
Roberto Jenkins   MRN: [id number]    DOB: Aug 17, 1960   Date:06/22/2017       Progress Note  Subjective  Chief Complaint  Chief Complaint  Patient presents with  . Follow-up    HTN, liver  . Allergies    HPI  Hypertension -maintained on amlodipine 5  Reports daily medication compliance without adverse medication effects. Reports he occasionally checks BP at home- recent readings-120s/60s Denies vision changes, chest pain, shortness of breath, edema.  BP Readings from Last 3 Encounters:  06/22/17 134/82  05/31/17 122/74  05/02/17 124/74   Abnormal liver function- Abnormal liver function on 02/08/18 labs, RUQ ultrasound 05/31/17 showed  Increased echotexture throughout the liver compatible with fatty infiltration. After working on healthy diet and exercise for about a month, he returned for follow up with normal CMET on 03/28/17. He has continued to work on [employer] and decreased alcohol intake. He denies any fevers, dizziness, abdominal pain, nausea, vomiting, pallor, jaundice, rashes  Wheezing- He has been treated with course of oral steroids, omnicef, levaquin for cough and acute bronchitis over the past 2 months. CXR on 4/3 showed mild COPD but no acute infection. He was given symbicort and nasonex at his OV on 4/3 which he has been using daily, the medications seem to help some but he continues to experience daily rhinorrhea, nasal congestion, cough, and wheezing. He says his symptoms feel like allergies. He denies weakness, heartburn, acid regurgitation. He is an occassional smoker  Patient Active Problem List   Diagnosis Date Noted  . Acute bronchitis 05/02/2017  . Wheezing 05/02/2017  . Fatty liver 03/28/2017  . Encounter for general adult medical examination with abnormal findings 02/08/2017  . Hypertension 02/08/2017  . Alcohol consumption heavy 02/08/2017  . Erectile dysfunction 02/05/2015  . Hx of adenomatous polyp of colon 12/02/2014  . Rash 09/27/2014   . Hemorrhoid 09/26/2014  . Low back pain 09/26/2014  . DEPRESSION 08/29/2007  . CONTACT DERMATITIS DUE TO POISON IVY 08/29/2007  . PERSONAL HISTORY OTHER DISORDER URINARY SYSTEM 08/29/2007    Past Surgical History:  Procedure Laterality Date  . COLONOSCOPY  2004   in [place];pt doesn't know MD name.  Marland Kitchen HERNIA REPAIR Bilateral   . NOSE SURGERY     x2    Family History  Adopted: Yes    Social History   Socioeconomic History  . Marital status: Divorced    Spouse name: Not on file  . Number of children: 1  . Years of education: 43  . Highest education level: Not on file  Occupational History  . Occupation: Chief Operating Officer  Social Needs  . Financial resource strain: Not on file  . Food insecurity:    Worry: Not on file    Inability: Not on file  . Transportation needs:    Medical: Not on file    Non-medical: Not on file  Tobacco Use  . Smoking status: Current Some Day Smoker    Packs/day: 1.00    Years: 38.00    Pack years: 38.00    Types: Cigarettes  . Smokeless tobacco: Never Used  Substance and Sexual Activity  . Alcohol use: Yes    Alcohol/week: 3.0 oz    Types: 5 Shots of liquor per week  . Drug use: Yes    Types: Amphetamines, LSD, Other-see comments    Comment: once every 3 months; occasionally, uses mushrooms.   . Sexual activity: Not on file  Lifestyle  . Physical activity:  Days per week: Not on file    Minutes per session: Not on file  . Stress: Not on file  Relationships  . Social connections:    Talks on phone: Not on file    Gets together: Not on file    Attends religious service: Not on file    Active member of club or organization: Not on file    Attends meetings of clubs or organizations: Not on file    Relationship status: Not on file  . Intimate partner violence:    Fear of current or ex partner: Not on file    Emotionally abused: Not on file    Physically abused: Not on file    Forced sexual activity: Not on file  Other Topics  Concern  . Not on file  Social History Narrative   Fun: Go to shows, occasionally workout.    Denies religious beliefs effecting health care.      Current Outpatient Medications:  .  amLODipine (NORVASC) 5 MG tablet, Take 1 tablet (5 mg total) by mouth daily., Disp: 90 tablet, Rfl: 3 .  fluticasone (FLONASE) 50 MCG/ACT nasal spray, Place into the nose., Disp: , Rfl:  .  triamcinolone (NASACORT) 55 MCG/ACT AERO nasal inhaler, Place 2 sprays into the nose daily., Disp: 1 Inhaler, Rfl: 6 .  valACYclovir (VALTREX) 1000 MG tablet, valacyclovir 1 gram tablet, Disp: , Rfl:  .  montelukast (SINGULAIR) 10 MG tablet, Take 1 tablet (10 mg total) by mouth at bedtime., Disp: 30 tablet, Rfl: 1  No Known Allergies   ROS See HPI  Objective  Vitals:   06/22/17 1505  BP: 134/82  Pulse: 74  Resp: 16  Temp: 98.6 F (37 C)  TempSrc: Oral  SpO2: 98%  Weight: 165 lb (74.8 kg)  Height: 5\' 11"  (1.803 m)    Body mass index is 23.01 kg/m.  Physical Exam Vital signs reviewed. Constitutional: Patient appears well-developed and well-nourished. No distress.  HENT: Head: Normocephalic and atraumatic. Nose: Nose normal. Mouth/Throat: Oropharynx is clear and moist.  Eyes: Conjunctivae and EOM are normal. Pupils are equal, round, and reactive to light. No scleral icterus.  Neck: Normal range of motion. Neck supple.  Cardiovascular: Normal rate, regular rhythm and normal heart sounds.  No BLE edema. Distal pulses intact. Pulmonary/Chest: Effort normal. No respiratory distress. No wheezing auscultated. Abdominal: Soft. Bowel sounds are normal, no distension. There is no tenderness. no masses Neurological: he is alert and oriented to person, place, and time.  Coordination, balance, strength, speech and gait are normal.  Skin: Skin is warm and dry. No rash noted. No erythema.  Psychiatric: Patient has a normal mood and affect. behavior is normal. Judgment and thought content normal.   Assessment &  Plan RTC in 2 weeks for F/U: cough, COPD, allergic rhinitis- continuing symbicort, nasacort and adding singulair, OTC antihistamine today   Wheezing - POCT EXHALED NITRIC OXIDE-24- cough and wheezing unlikely asthma, could be related to his allergy symptoms Continue nasacort, Start singulair- dosing and side effects discussed Discussed management of allergies at home, adding daily claritin or zytrec, and return precautions, and printed information on AVS Encouraged to stop smoking RTC in 2 weeks for follow up - montelukast (SINGULAIR) 10 MG tablet; Take 1 tablet (10 mg total) by mouth at bedtime.  Dispense: 30 tablet; Refill: 1

## 2017-06-22 NOTE — Assessment & Plan Note (Signed)
Stable, continue amlodipine.  

## 2017-06-22 NOTE — Patient Instructions (Addendum)
Please continue nasacort. Please start singulair daily to help with allergy symptoms Please returnin about 2-3 weeks, I want to make sure that your cough and wheezing are getting better. Please stop smoking.  Please head downstairs for lab work/x-rays. If any of your test results are critically abnormal, you will be contacted right away. Your results may be released to your MyChart for viewing before I am able to provide you with my response. I will contact you within a week about your test results and any recommendations for abnormalities.  Allergic Rhinitis, Adult Allergic rhinitis is an allergic reaction that affects the mucous membrane inside the nose. It causes sneezing, a runny or stuffy nose, and the feeling of mucus going down the back of the throat (postnasal drip). Allergic rhinitis can be mild to severe. There are two types of allergic rhinitis:  Seasonal. This type is also called hay fever. It happens only during certain seasons.  Perennial. This type can happen at any time of the year.  What are the causes? This condition happens when the body's defense system (immune system) responds to certain harmless substances called allergens as though they were germs.  Seasonal allergic rhinitis is triggered by pollen, which can come from grasses, trees, and weeds. Perennial allergic rhinitis may be caused by:  House dust mites.  Pet dander.  Mold spores.  What are the signs or symptoms? Symptoms of this condition include:  Sneezing.  Runny or stuffy nose (nasal congestion).  Postnasal drip.  Itchy nose.  Tearing of the eyes.  Trouble sleeping.  Daytime sleepiness.  How is this diagnosed? This condition may be diagnosed based on:  Your medical history.  A physical exam.  Tests to check for related conditions, such as: ? Asthma. ? Pink eye. ? Ear infection. ? Upper respiratory infection.  Tests to find out which allergens trigger your symptoms. These may  include skin or blood tests.  How is this treated? There is no cure for this condition, but treatment can help control symptoms. Treatment may include:  Taking medicines that block allergy symptoms, such as antihistamines. Medicine may be given as a shot, nasal spray, or pill.  Avoiding the allergen.  Desensitization. This treatment involves getting ongoing shots until your body becomes less sensitive to the allergen. This treatment may be done if other treatments do not help.  If taking medicine and avoiding the allergen does not work, new, stronger medicines may be prescribed.  Follow these instructions at home:  Find out what you are allergic to. Common allergens include smoke, dust, and pollen.  Avoid the things you are allergic to. These are some things you can do to help avoid allergens: ? Replace carpet with wood, tile, or vinyl flooring. Carpet can trap dander and dust. ? Do not smoke. Do not allow smoking in your home. ? Change your heating and air conditioning filter at least once a month. ? During allergy season:  Keep windows closed as much as possible.  Plan outdoor activities when pollen counts are lowest. This is usually during the evening hours.  When coming indoors, change clothing and shower before sitting on furniture or bedding.  Take over-the-counter and prescription medicines only as told by your health care provider.  Keep all follow-up visits as told by your health care provider. This is important. Contact a health care provider if:  You have a fever.  You develop a persistent cough.  You make whistling sounds when you breathe (you wheeze).  Your symptoms  interfere with your normal daily activities. Get help right away if:  You have shortness of breath. Summary  This condition can be managed by taking medicines as directed and avoiding allergens.  Contact your health care provider if you develop a persistent cough or fever.  During allergy  season, keep windows closed as much as possible. This information is not intended to replace advice given to you by your health care provider. Make sure you discuss any questions you have with your health care provider. Document Released: 11/09/2000 Document Revised: 03/24/2016 Document Reviewed: 03/24/2016 Elsevier Interactive Patient Education  [employer].

## 2017-06-23 NOTE — Telephone Encounter (Signed)
LVM letting pt know response below. Asked pt to call back with any questions or concerns if needed.

## 2017-11-13 ENCOUNTER — Ambulatory Visit (INDEPENDENT_AMBULATORY_CARE_PROVIDER_SITE_OTHER): Payer: BLUE CROSS/BLUE SHIELD

## 2017-11-13 DIAGNOSIS — Z23 Encounter for immunization: Secondary | ICD-10-CM | POA: Diagnosis not present

## 2017-11-23 ENCOUNTER — Other Ambulatory Visit: Payer: Self-pay | Admitting: Family

## 2018-01-29 ENCOUNTER — Other Ambulatory Visit: Payer: Self-pay | Admitting: Nurse Practitioner

## 2018-01-29 DIAGNOSIS — R030 Elevated blood-pressure reading, without diagnosis of hypertension: Secondary | ICD-10-CM

## 2018-01-30 ENCOUNTER — Encounter: Payer: Self-pay | Admitting: Nurse Practitioner

## 2018-02-01 ENCOUNTER — Ambulatory Visit: Payer: BLUE CROSS/BLUE SHIELD | Admitting: Nurse Practitioner

## 2018-02-01 ENCOUNTER — Other Ambulatory Visit (INDEPENDENT_AMBULATORY_CARE_PROVIDER_SITE_OTHER): Payer: BLUE CROSS/BLUE SHIELD

## 2018-02-01 ENCOUNTER — Encounter: Payer: Self-pay | Admitting: Nurse Practitioner

## 2018-02-01 VITALS — BP 122/86 | HR 88 | Temp 99.0°F | Ht 71.0 in | Wt 169.0 lb

## 2018-02-01 DIAGNOSIS — T148XXA Other injury of unspecified body region, initial encounter: Secondary | ICD-10-CM | POA: Diagnosis not present

## 2018-02-01 DIAGNOSIS — J069 Acute upper respiratory infection, unspecified: Secondary | ICD-10-CM | POA: Diagnosis not present

## 2018-02-01 LAB — CBC
HCT: 46.5 % (ref 39.0–52.0)
Hemoglobin: 15.8 g/dL (ref 13.0–17.0)
MCHC: 33.9 g/dL (ref 30.0–36.0)
MCV: 91.1 fl (ref 78.0–100.0)
Platelets: 232 10*3/uL (ref 150.0–400.0)
RBC: 5.11 Mil/uL (ref 4.22–5.81)
RDW: 12.9 % (ref 11.5–15.5)
WBC: 7.6 10*3/uL (ref 4.0–10.5)

## 2018-02-01 LAB — APTT: aPTT: 31 s (ref 23.4–32.7)

## 2018-02-01 LAB — PROTIME-INR
INR: 0.9 ratio (ref 0.8–1.0)
PROTHROMBIN TIME: 10.6 s (ref 9.6–13.1)

## 2018-02-01 MED ORDER — LEVOCETIRIZINE DIHYDROCHLORIDE 5 MG PO TABS
5.0000 mg | ORAL_TABLET | Freq: Every evening | ORAL | 3 refills | Status: DC
Start: 2018-02-01 — End: 2021-10-22

## 2018-02-01 NOTE — Patient Instructions (Signed)
Continue flonase daily Saline nasal spray used frequently. For drainage try xyzal. If you need stronger medicine to stop drainage may take Chlor-Trimeton 2-4 mg every 4 hours. This may cause drowsiness. Ibuprofen 600 mg every 6 hours as needed for pain, discomfort or fever. Drink plenty of fluids and stay well-hydrated.  Please follow up for fevers over 101, if your symptoms get worse, or if your symptoms dont get better in 1 week.  Head downstairs for labs on your way out today.   Upper Respiratory Infection, Adult Most upper respiratory infections (URIs) are caused by a virus. A URI affects the nose, throat, and upper air passages. The most common type of URI is often called "the common cold." Follow these instructions at home:  Take medicines only as told by your doctor.  Gargle warm saltwater or take cough drops to comfort your throat as told by your doctor.  Use a warm mist humidifier or inhale steam from a shower to increase air moisture. This may make it easier to breathe.  Drink enough fluid to keep your pee (urine) clear or pale yellow.  Eat soups and other clear broths.  Have a healthy diet.  Rest as needed.  Go back to work when your fever is gone or your doctor says it is okay. ? You may need to stay home longer to avoid giving your URI to others. ? You can also wear a face mask and wash your hands often to prevent spread of the virus.  Use your inhaler more if you have asthma.  Do not use any tobacco products, including cigarettes, chewing tobacco, or electronic cigarettes. If you need help quitting, ask your doctor. Contact a doctor if:  You are getting worse, not better.  Your symptoms are not helped by medicine.  You have chills.  You are getting more short of breath.  You have brown or red mucus.  You have yellow or brown discharge from your nose.  You have pain in your face, especially when you bend forward.  You have a fever.  You have puffy  (swollen) neck glands.  You have pain while swallowing.  You have white areas in the back of your throat. Get help right away if:  You have very bad or constant: ? Headache. ? Ear pain. ? Pain in your forehead, behind your eyes, and over your cheekbones (sinus pain). ? Chest pain.  You have long-lasting (chronic) lung disease and any of the following: ? Wheezing. ? Long-lasting cough. ? Coughing up blood. ? A change in your usual mucus.  You have a stiff neck.  You have changes in your: ? Vision. ? Hearing. ? Thinking. ? Mood. This information is not intended to replace advice given to you by your health care provider. Make sure you discuss any questions you have with your health care provider. Document Released: 08/03/2007 Document Revised: 10/18/2015 Document Reviewed: 05/22/2013 Elsevier Interactive Patient Education  2018 [employer].

## 2018-02-01 NOTE — Progress Notes (Signed)
Roberto Jenkins is a 57 y.o. male with the following history as recorded in [facility]:  Patient Active Problem List   Diagnosis Date Noted  . Chronic obstructive pulmonary disease ([place]) 06/22/2017  . Acute bronchitis 05/02/2017  . Wheezing 05/02/2017  . Fatty liver 03/28/2017  . Encounter for general adult medical examination with abnormal findings 02/08/2017  . Hypertension 02/08/2017  . Alcohol consumption heavy 02/08/2017  . Erectile dysfunction 02/05/2015  . Hx of adenomatous polyp of colon 12/02/2014  . Rash 09/27/2014  . Hemorrhoid 09/26/2014  . Low back pain 09/26/2014  . DEPRESSION 08/29/2007  . CONTACT DERMATITIS DUE TO POISON IVY 08/29/2007  . PERSONAL HISTORY OTHER DISORDER URINARY SYSTEM 08/29/2007    Current Outpatient Medications  Medication Sig Dispense Refill  . amLODipine (NORVASC) 5 MG tablet TAKE 1 TABLET(5 MG) BY MOUTH DAILY 90 tablet 0  . budesonide-formoterol (SYMBICORT) 160-4.5 MCG/ACT inhaler Inhale 2 puffs into the lungs 2 (two) times daily. Needs appointment for further refills. 10.2 g 0  . fluticasone (FLONASE) 50 MCG/ACT nasal spray Place into the nose.    . montelukast (SINGULAIR) 10 MG tablet Take 1 tablet (10 mg total) by mouth at bedtime. 30 tablet 1  . triamcinolone (NASACORT) 55 MCG/ACT AERO nasal inhaler Place 2 sprays into the nose daily. 1 Inhaler 6  . valACYclovir (VALTREX) 1000 MG tablet valacyclovir 1 gram tablet    . levocetirizine (XYZAL) 5 MG tablet Take 1 tablet (5 mg total) by mouth every evening. 30 tablet 3   No current facility-administered medications for this visit.     Allergies: Patient has no known allergies.  Past Medical History:  Diagnosis Date  . Asthma   . Heart murmur    childhood  . Hx of adenomatous polyp of colon 12/02/2014    Past Surgical History:  Procedure Laterality Date  . COLONOSCOPY  2004   in [place];pt doesn't know MD name.  Marland Kitchen HERNIA REPAIR Bilateral   . NOSE SURGERY     x2    Family History   Adopted: Yes    Social History   Tobacco Use  . Smoking status: Current Some Day Smoker    Packs/day: 1.00    Years: 38.00    Pack years: 38.00    Types: Cigarettes  . Smokeless tobacco: Never Used  Substance Use Topics  . Alcohol use: Yes    Alcohol/week: 5.0 standard drinks    Types: 5 Shots of liquor per week     Subjective:  Mr Gallery is here today for evaluation of two acute complaints: cough/ cold symptoms and bruising He reports facial pressure, nasal congestion and drainage, sore throat, cough with green sputum, low grade fevers, body aches which first started about 3 days ago on Monday. 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 Does not notice the bruising on other parts of body besides his forearms No weakness, syncope, chest pain, SOB, abdominal pain, nausea,vomiting, abnormal bleeding.   ROS- See HPI   Objective:  Vitals:   02/01/18 1256  BP: 122/86  Pulse: 88  Temp: 99 F (37.2 C)  TempSrc: Oral  SpO2: 97%  Weight: 169 lb (76.7 kg)  Height:  5\' 11"  (1.803 m)    General: Well developed, well nourished, in no acute distress  Skin : Warm and dry. Scattered ecchymosis to bilateral distal forearms Head: Normocephalic and atraumatic  Eyes: Sclera and conjunctiva clear; pupils round and reactive to light; extraocular movements intact  Ears: External normal; canals clear; tympanic membranes bulging bilaterally Oropharynx: Pink, supple. No suspicious lesions  Neck: Supple without thyromegaly, adenopathy  Lungs: Respirations unlabored; without wheeze, rales, rhonchi  CVS exam: normal rate and regular rhythm, S1 and S2 normal.  Musculoskeletal: No deformities; no active  joint inflammation  Extremities: No edema, cyanosis Vessels: Symmetric bilaterally  Neurologic: Alert and oriented; speech intact; face symmetrical; moves all extremities well; CNII-XII intact without focal deficit  Psychiatric: Normal mood and affect.  Assessment:  1. Viral URI   2. Bruising     Plan:   1. Viral URI Due to length of symptoms, suspect viral etiology at this point which we discussed Home management, red flags and return precautions including when to seek immediate care discussed and printed on AVS xyzal rx sent to try-dosing, side effects discussed  2. Bruising Could be related to reported injuries Check labs today F/U with further recommendations pending lab results - CBC; Future - PTT; Future - INR/PT; Future'  Return for cpe.  Orders Placed This Encounter  Procedures  . CBC    Standing Status:   Future    Number of Occurrences:   1    Standing Expiration Date:   02/02/2019  . PTT    Standing Status:   Future    Number of Occurrences:   1    Standing Expiration Date:   02/01/2019  . INR/PT    Standing Status:   Future    Number of Occurrences:   1    Standing Expiration Date:   02/01/2019    Requested Prescriptions   Signed Prescriptions Disp Refills  . levocetirizine (XYZAL) 5 MG tablet 30 tablet 3    Sig: Take 1 tablet (5 mg total) by mouth every evening.

## 2018-02-19 ENCOUNTER — Other Ambulatory Visit: Payer: Self-pay | Admitting: Nurse Practitioner

## 2018-04-16 DIAGNOSIS — J4530 Mild persistent asthma, uncomplicated: Secondary | ICD-10-CM | POA: Insufficient documentation

## 2019-04-26 ENCOUNTER — Ambulatory Visit (HOSPITAL_COMMUNITY)
Admission: EM | Admit: 2019-04-26 | Discharge: 2019-04-26 | Disposition: A | Discharge status: Home / Self Care | Payer: 59

## 2019-04-26 ENCOUNTER — Encounter (HOSPITAL_COMMUNITY): Payer: Self-pay

## 2019-04-26 ENCOUNTER — Telehealth: Payer: Self-pay | Admitting: Nurse Practitioner

## 2019-04-26 ENCOUNTER — Ambulatory Visit (INDEPENDENT_AMBULATORY_CARE_PROVIDER_SITE_OTHER): Payer: 59

## 2019-04-26 ENCOUNTER — Other Ambulatory Visit: Payer: Self-pay

## 2019-04-26 DIAGNOSIS — J449 Chronic obstructive pulmonary disease, unspecified: Secondary | ICD-10-CM

## 2019-04-26 DIAGNOSIS — Z8616 Personal history of COVID-19: Secondary | ICD-10-CM

## 2019-04-26 DIAGNOSIS — R05 Cough: Secondary | ICD-10-CM | POA: Diagnosis not present

## 2019-04-26 DIAGNOSIS — R0602 Shortness of breath: Secondary | ICD-10-CM

## 2019-04-26 DIAGNOSIS — R0789 Other chest pain: Secondary | ICD-10-CM

## 2019-04-26 DIAGNOSIS — R059 Cough, unspecified: Secondary | ICD-10-CM

## 2019-04-26 MED ORDER — BENZONATATE 100 MG PO CAPS: 100.0000 mg | ORAL_CAPSULE | Freq: Three times a day (TID) | ORAL | 0 refills | Status: DC | PRN

## 2019-04-26 MED ORDER — PREDNISONE 20 MG PO TABS: ORAL_TABLET | ORAL | 0 refills | Status: DC

## 2019-04-26 MED ORDER — PROMETHAZINE-DM 6.25-15 MG/5ML PO SYRP: 5.0000 mL | ORAL_SOLUTION | Freq: Every evening | ORAL | 0 refills | Status: DC | PRN

## 2019-04-26 NOTE — Telephone Encounter (Signed)
New message:  Pt is calling and states he has some ear ringing and some chest tightness here and there. Pt states he had Covid 10 days ago so I have scheduled him for a doxy visit on 04/29/19. He states these symptoms started happening after he was over Covid.

## 2019-04-26 NOTE — Telephone Encounter (Signed)
The symptom to chest tightness is concerning - it may be from covid, but w/o seeing him it is hard to know -- he should probably not wait until Monday to be seen  - I would recommend urgent care or possibly the resp clinic for in person evaluation

## 2019-04-26 NOTE — ED Triage Notes (Signed)
Pt states he had a rapid COVID test on 02/08 performed at [facility] (positive).  Pt c/o cough, mild SOB and chest discomfort that increases with cough since Monday. Also reports HA.  Denies, dizziness, fever at present. Last febrile event 02/12.  Requests CXR.

## 2019-04-26 NOTE — Telephone Encounter (Signed)
Sees you on Monday.

## 2019-04-26 NOTE — Telephone Encounter (Signed)
Spoke with pt and advised going to UC to get check out. No appointments available today at resp clinic. Appointment for Monday canceled as pt is going to try UC today.

## 2019-04-26 NOTE — ED Provider Notes (Signed)
[place]   MRN: [id number] DOB: September 18, 1960  Subjective:   Roberto Jenkins is a 59 y.o. male presenting for 5-day history of acute onset recurrent shortness of breath, cough and chest pain.  Patient was diagnosed with COVID-19 on April 12, 2019.  He had good improvement until this week.  He has also developed a rash, saw a dermatologist for it and they are working with him on this.  He has a history of COPD and is using Symbicort, albuterol inhaler.  No current facility-administered medications for this encounter.  Current Outpatient Medications:  .  albuterol (VENTOLIN HFA) 108 (90 Base) MCG/ACT inhaler, Inhale into the lungs., Disp: , Rfl:  .  amLODipine (NORVASC) 5 MG tablet, TAKE 1 TABLET(5 MG) BY MOUTH DAILY, Disp: 90 tablet, Rfl: 0 .  cetirizine (ZYRTEC) 10 MG tablet, Take by mouth., Disp: , Rfl:  .  fluticasone (FLONASE) 50 MCG/ACT nasal spray, Place into the nose., Disp: , Rfl:  .  triamcinolone (NASACORT) 55 MCG/ACT AERO nasal inhaler, Place 2 sprays into the nose daily., Disp: 1 Inhaler, Rfl: 6 .  levocetirizine (XYZAL) 5 MG tablet, Take 1 tablet (5 mg total) by mouth every evening., Disp: 30 tablet, Rfl: 3 .  montelukast (SINGULAIR) 10 MG tablet, Take 1 tablet (10 mg total) by mouth at bedtime., Disp: 30 tablet, Rfl: 1 .  SYMBICORT 160-4.5 MCG/ACT inhaler, INHALE 2 PUFFS INTO THE LUNGS TWICE DAILY, Disp: 10.2 g, Rfl: 0 .  valACYclovir (VALTREX) 1000 MG tablet, valacyclovir 1 gram tablet, Disp: , Rfl:    Allergies  Allergen Reactions  . Montelukast Other (See Comments)    WEIRD THOUGHTS    Past Medical History:  Diagnosis Date  . Asthma   . Heart murmur    childhood  . Hx of adenomatous polyp of colon 12/02/2014     Past Surgical History:  Procedure Laterality Date  . COLONOSCOPY  2004   in [place];pt doesn't know MD name.  Marland Kitchen HERNIA REPAIR Bilateral   . NOSE SURGERY     x2    Family History  Adopted: Yes  Family history unknown: Yes     Social History   Tobacco Use  . Smoking status: Current Some Day Smoker    Packs/day: 1.00    Years: 38.00    Pack years: 38.00    Types: Cigarettes  . Smokeless tobacco: Never Used  Substance Use Topics  . Alcohol use: Yes    Alcohol/week: 5.0 standard drinks    Types: 5 Shots of liquor per week  . Drug use: Yes    Types: Amphetamines, LSD, Other-see comments    Comment: once every 3 months; occasionally, uses mushrooms.     Review of Systems  Constitutional: Positive for malaise/fatigue. Negative for fever.  HENT: Negative for congestion, ear pain, sinus pain and sore throat.   Eyes: Negative for discharge and redness.  Respiratory: Positive for cough and shortness of breath. Negative for hemoptysis and wheezing.   Cardiovascular: Positive for chest pain (Lower, intermittent).  Gastrointestinal: Negative for abdominal pain, diarrhea, nausea and vomiting.  Genitourinary: Negative for dysuria, flank pain and hematuria.  Musculoskeletal: Negative for myalgias.  Skin: Positive for itching and rash.  Neurological: Negative for dizziness, weakness and headaches.  Psychiatric/Behavioral: Negative for depression and substance abuse.     Objective:   Vitals: BP 132/65 (BP Location: Left Arm)   Pulse 85   Temp 98.1 F (36.7 C) (Oral)   Resp 18   SpO2 98%  Physical Exam Constitutional:      General: He is not in acute distress.    Appearance: Normal appearance. He is well-developed. He is not ill-appearing, toxic-appearing or diaphoretic.  HENT:     Head: Normocephalic and atraumatic.     Right Ear: External ear normal.     Left Ear: External ear normal.     Nose: Nose normal.     Mouth/Throat:     Mouth: Mucous membranes are moist.     Pharynx: Oropharynx is clear.  Eyes:     General: No scleral icterus.    Extraocular Movements: Extraocular movements intact.     Pupils: Pupils are equal, round, and reactive to light.  Cardiovascular:     Rate and Rhythm:  Normal rate and regular rhythm.     Heart sounds: Normal heart sounds. No murmur. No friction rub. No gallop.   Pulmonary:     Effort: Pulmonary effort is normal. No respiratory distress.     Breath sounds: Normal breath sounds. No stridor. No decreased breath sounds, wheezing, rhonchi or rales.     Comments: Coarse lung sounds throughout consistent with COPD. Skin:    General: Skin is warm and dry.  Neurological:     Mental Status: He is alert and oriented to person, place, and time.  Psychiatric:        Mood and Affect: Mood normal.        Behavior: Behavior normal.        Thought Content: Thought content normal.        Judgment: Judgment normal.     DG Chest 2 View  Result Date: 04/26/2019 CLINICAL DATA:  Cough, shortness of breath, chest pain EXAM: CHEST - 2 VIEW COMPARISON:  05/31/2017 FINDINGS: The heart size and mediastinal contours are within normal limits. Both lungs are clear. The visualized skeletal structures are unremarkable. IMPRESSION: No acute abnormality of the lungs. Electronically Signed   By: Eddie Candle M.D.   On: 04/26/2019 17:05     Assessment and Plan :   1. Cough   2. Shortness of breath   3. Atypical chest pain   4. Chronic obstructive pulmonary disease, unspecified COPD type ([place])     Start prednisone course, use cough suppression medications.  Emphasized need to maintain his regular treatments.  I do not suspect that he is having complications from [id number].  Counseled that if his rash is allergic/contact dermatitis that prednisone course may help as well.  Follow-up with PCP and pulmonologist if symptoms persist.  Follow-up with dermatology if his rash persists.  Counseled patient on potential for adverse effects with medications prescribed/recommended today, ER and return-to-clinic precautions discussed, patient verbalized understanding.    Jaynee Eagles, [place] 04/26/19 1743

## 2019-04-29 ENCOUNTER — Ambulatory Visit: Payer: BLUE CROSS/BLUE SHIELD | Admitting: Internal Medicine

## 2019-05-15 ENCOUNTER — Emergency Department (HOSPITAL_COMMUNITY)
Admission: EM | Admit: 2019-05-15 | Discharge: 2019-05-15 | Disposition: A | Discharge status: Home / Self Care | Payer: 59 | Attending: Emergency Medicine | Admitting: Emergency Medicine

## 2019-05-15 ENCOUNTER — Telehealth: Payer: Self-pay | Admitting: Cardiology

## 2019-05-15 ENCOUNTER — Other Ambulatory Visit: Payer: Self-pay

## 2019-05-15 ENCOUNTER — Emergency Department (HOSPITAL_COMMUNITY): Payer: 59

## 2019-05-15 DIAGNOSIS — Z79899 Other long term (current) drug therapy: Secondary | ICD-10-CM | POA: Insufficient documentation

## 2019-05-15 DIAGNOSIS — J45909 Unspecified asthma, uncomplicated: Secondary | ICD-10-CM | POA: Insufficient documentation

## 2019-05-15 DIAGNOSIS — I10 Essential (primary) hypertension: Secondary | ICD-10-CM | POA: Diagnosis not present

## 2019-05-15 DIAGNOSIS — F17210 Nicotine dependence, cigarettes, uncomplicated: Secondary | ICD-10-CM | POA: Insufficient documentation

## 2019-05-15 DIAGNOSIS — R079 Chest pain, unspecified: Secondary | ICD-10-CM | POA: Diagnosis present

## 2019-05-15 DIAGNOSIS — R0789 Other chest pain: Secondary | ICD-10-CM | POA: Diagnosis not present

## 2019-05-15 LAB — CBC
HCT: 49.8 % (ref 39.0–52.0)
Hemoglobin: 16.8 g/dL (ref 13.0–17.0)
MCH: 31.1 pg (ref 26.0–34.0)
MCHC: 33.7 g/dL (ref 30.0–36.0)
MCV: 92.2 fL (ref 80.0–100.0)
Platelets: 231 10*3/uL (ref 150–400)
RBC: 5.4 MIL/uL (ref 4.22–5.81)
RDW: 13.6 % (ref 11.5–15.5)
WBC: 7.5 10*3/uL (ref 4.0–10.5)
nRBC: 0 % (ref 0.0–0.2)

## 2019-05-15 LAB — BASIC METABOLIC PANEL
Anion gap: 14 (ref 5–15)
BUN: 7 mg/dL (ref 6–20)
CO2: 24 mmol/L (ref 22–32)
Calcium: 9.5 mg/dL (ref 8.9–10.3)
Chloride: 102 mmol/L (ref 98–111)
Creatinine, Ser: 0.74 mg/dL (ref 0.61–1.24)
GFR calc Af Amer: >60 mL/min (ref >60)
GFR calc non Af Amer: >60 mL/min (ref >60)
Glucose, Bld: 91 mg/dL (ref 70–99)
Potassium: 4.5 mmol/L (ref 3.5–5.1)
Sodium: 140 mmol/L (ref 135–145)

## 2019-05-15 LAB — TROPONIN I (HIGH SENSITIVITY): Troponin I (High Sensitivity): 5 ng/L (ref <18)

## 2019-05-15 MED ORDER — PREDNISONE 10 MG (21) PO TBPK: ORAL_TABLET | Freq: Every day | ORAL | 0 refills | Status: DC

## 2019-05-15 MED ORDER — PREDNISONE 20 MG PO TABS
60.0000 mg | ORAL_TABLET | Freq: Once | ORAL | Status: AC
  Administered 2019-05-15: 60 mg via ORAL
  Filled 2019-05-15: qty 3

## 2019-05-15 MED ORDER — SODIUM CHLORIDE 0.9% FLUSH: 3.0000 mL | Freq: Once | INTRAVENOUS | Status: DC

## 2019-05-15 NOTE — ED Provider Notes (Signed)
[facility] EMERGENCY DEPARTMENT Provider Note   CSN: [id number] Arrival date & time: 05/15/19  1346     History Chief Complaint  Patient presents with  . Chest Pain    KELSO SCHNACK is a 59 y.o. male with past medical history significant for asthma, Covid positive on 04/09/2019 presents to emergency department today with chief complaint of chest pain x1 day.  He describes the chest pain as a chest tightness located in the middle of his chest.  He noticed the chest tightness when he woke up this morning, denies pain being worse with exertion.  He did not take any medications for symptoms prior to arrival.  Patient states ever since his Covid diagnosis he has had daily chest tightness.  He became concerned today when the chest tightness lasted longer than it usually does.  He called his cardiologist who recommended he be evaluated in the emergency department.  Denies dyspnea on exertion, SOB, chest tightness or pressure, radiation to left/right arm, jaw or back, nausea, or diaphoresis.  Patient is adopted and does not know his cardiac family history.  He denies personal history of clotting disorder.   Past Medical History:  Diagnosis Date  . Asthma   . Heart murmur    childhood  . Hx of adenomatous polyp of colon 12/02/2014    Patient Active Problem List   Diagnosis Date Noted  . Chronic obstructive pulmonary disease ([place]) 06/22/2017  . Acute bronchitis 05/02/2017  . Wheezing 05/02/2017  . Fatty liver 03/28/2017  . Encounter for general adult medical examination with abnormal findings 02/08/2017  . Hypertension 02/08/2017  . Alcohol consumption heavy 02/08/2017  . Erectile dysfunction 02/05/2015  . Hx of adenomatous polyp of colon 12/02/2014  . Rash 09/27/2014  . Hemorrhoid 09/26/2014  . Low back pain 09/26/2014  . DEPRESSION 08/29/2007  . CONTACT DERMATITIS DUE TO POISON IVY 08/29/2007  . PERSONAL HISTORY OTHER DISORDER URINARY SYSTEM 08/29/2007    Past  Surgical History:  Procedure Laterality Date  . COLONOSCOPY  2004   in [place];pt doesn't know MD name.  Marland Kitchen HERNIA REPAIR Bilateral   . NOSE SURGERY     x2       Family History  Adopted: Yes  Family history unknown: Yes    Social History   Tobacco Use  . Smoking status: Current Some Day Smoker    Packs/day: 1.00    Years: 38.00    Pack years: 38.00    Types: Cigarettes  . Smokeless tobacco: Never Used  Substance Use Topics  . Alcohol use: Yes    Alcohol/week: 5.0 standard drinks    Types: 5 Shots of liquor per week  . Drug use: Yes    Types: Amphetamines, LSD, Other-see comments    Comment: once every 3 months; occasionally, uses mushrooms.     Home Medications Prior to Admission medications   Medication Sig Start Date End Date Taking? Authorizing Provider  acetaminophen (TYLENOL) 500 MG tablet Take 500-1,000 mg by mouth every 6 (six) hours as needed for mild pain or headache.   Yes [provider]  albuterol (VENTOLIN HFA) 108 (90 Base) MCG/ACT inhaler Inhale 2 puffs into the lungs every 6 (six) hours as needed for wheezing or shortness of breath (or chest tightness).  04/16/18  Yes [provider]  amLODipine (NORVASC) 5 MG tablet TAKE 1 TABLET(5 MG) BY MOUTH DAILY Patient taking differently: Take 5 mg by mouth at bedtime.  01/29/18  Yes Lance Sell,  NP  cetirizine (ZYRTEC) 10 MG tablet Take 10 mg by mouth at bedtime.    Yes [provider]  SYMBICORT 160-4.5 MCG/ACT inhaler INHALE 2 PUFFS INTO THE LUNGS TWICE DAILY Patient taking differently: Inhale 2 puffs into the lungs in the morning and at bedtime.  02/19/18  Yes Lance Sell, NP  triamcinolone (NASACORT) 55 MCG/ACT AERO nasal inhaler Place 2 sprays into the nose daily. 05/31/17  Yes Marrian Salvage, FNP  benzonatate (TESSALON) 100 MG capsule Take 1-2 capsules (100-200 mg total) by mouth 3 (three) times daily as needed. Patient not taking: Reported on 05/15/2019  04/26/19   Jaynee Eagles, PA-C  levocetirizine (XYZAL) 5 MG tablet Take 1 tablet (5 mg total) by mouth every evening. Patient not taking: Reported on 05/15/2019 02/01/18   Lance Sell, NP  montelukast (SINGULAIR) 10 MG tablet Take 1 tablet (10 mg total) by mouth at bedtime. Patient not taking: Reported on 05/15/2019 06/22/17   Lance Sell, NP  predniSONE (STERAPRED UNI-PAK 21 TAB) 10 MG (21) TBPK tablet Take by mouth daily. Take 6 tabs by mouth daily  for 2 days, then 5 tabs for 2 days, then 4 tabs for 2 days, then 3 tabs for 2 days, 2 tabs for 2 days, then 1 tab by mouth daily for 2 days 05/15/19   Domnic Vantol E, PA-C  promethazine-dextromethorphan (PROMETHAZINE-DM) 6.25-15 MG/5ML syrup Take 5 mLs by mouth at bedtime as needed for cough. Patient not taking: Reported on 05/15/2019 04/26/19   Jaynee Eagles, PA-C    Allergies    Montelukast  Review of Systems   Review of Systems  All other systems are reviewed and are negative for acute change except as noted in the HPI.   Physical Exam Updated Vital Signs BP (!) 111/93   Pulse 89   Temp 98.4 F (36.9 C) (Oral)   Resp 18   Ht 5\' 11"  (1.803 m)   Wt 82.1 kg   SpO2 100%   BMI 25.24 kg/m   Physical Exam Vitals and nursing note reviewed.  Constitutional:      General: He is not in acute distress.    Appearance: He is not ill-appearing or diaphoretic.  HENT:     Head: Normocephalic and atraumatic.     Right Ear: Tympanic membrane and external ear normal.     Left Ear: Tympanic membrane and external ear normal.     Nose: Nose normal.     Mouth/Throat:     Mouth: Mucous membranes are moist.     Pharynx: Oropharynx is clear.  Eyes:     General: No scleral icterus.       Right eye: No discharge.        Left eye: No discharge.     Extraocular Movements: Extraocular movements intact.     Conjunctiva/sclera: Conjunctivae normal.     Pupils: Pupils are equal, round, and reactive to light.  Neck:     Vascular: No JVD.    Cardiovascular:     Rate and Rhythm: Normal rate and regular rhythm.     Pulses: Normal pulses.          Radial pulses are 2+ on the right side and 2+ on the left side.     Heart sounds: Normal heart sounds. No murmur.  Pulmonary:     Comments: Lungs clear to auscultation in all fields. Symmetric chest rise. No wheezing, rales, or rhonchi.  SPO2 is 100% on room air during exam. Abdominal:  Comments: Abdomen is soft, non-distended, and non-tender in all quadrants. No rigidity, no guarding. No peritoneal signs.  Musculoskeletal:        General: Normal range of motion.     Cervical back: Normal range of motion.     Right lower leg: No edema.     Left lower leg: No edema.     Comments: Homans sign absent bilaterally, no lower extremity edema, no palpable cords, compartments are soft  Skin:    General: Skin is warm and dry.     Capillary Refill: Capillary refill takes less than 2 seconds.  Neurological:     Mental Status: He is oriented to person, place, and time.     GCS: GCS eye subscore is 4. GCS verbal subscore is 5. GCS motor subscore is 6.     Comments: Fluent speech, no facial droop.  Psychiatric:        Behavior: Behavior normal.       ED Results / Procedures / Treatments   Labs (all labs ordered are listed, but only abnormal results are displayed) Labs Reviewed  BASIC METABOLIC PANEL  CBC  TROPONIN I (HIGH SENSITIVITY)    EKG EKG Interpretation  Date/Time:  Wednesday May 15 2019 13:47:37 EDT Ventricular Rate:  99 PR Interval:  150 QRS Duration: 108 QT Interval:  396 QTC Calculation: 508 R Axis:     Text Interpretation: Normal sinus rhythm Minimal voltage criteria for LVH, may be normal variant ( Cornell product ) Prolonged QT Abnormal ECG No previous ECGs available Confirmed by Gerlene Fee [id number]) on 05/15/2019 5:07:44 PM   Radiology DG Chest 2 View  Result Date: 05/15/2019 CLINICAL DATA:  Chest pain EXAM: CHEST - 2 VIEW COMPARISON:  04/26/2019  FINDINGS: The heart size and mediastinal contours are within normal limits. Both lungs are clear. The visualized skeletal structures are unremarkable. IMPRESSION: No active cardiopulmonary disease. Electronically Signed   By: Davina Poke D.O.   On: 05/15/2019 14:28    Procedures Procedures (including critical care time)  Medications Ordered in ED Medications  sodium chloride flush (NS) 0.9 % injection 3 mL (has no administration in time range)  predniSONE (DELTASONE) tablet 60 mg (has no administration in time range)    ED Course  I have reviewed the triage vital signs and the nursing notes.  Pertinent labs & imaging results that were available during my care of the patient were reviewed by me and considered in my medical decision making (see chart for details).  Vitals:   05/15/19 1354 05/15/19 1530  BP: (!) 111/93 140/79  Pulse: 89   Resp: 18 19  Temp: 98.4 F (36.9 C)   TempSrc: Oral   SpO2: 100%   Weight: 82.1 kg   Height: 5\' 11"  (1.803 m)       MDM Rules/Calculators/A&P                       Patient seen and examined. Patient presents awake, alert, hemodynamically stable, afebrile, non toxic.  No tachycardia or hypoxia patient is very well-appearing.  On my exam he is resting comfortably on the stretcher.  His lungs are clear to auscultation all fields, no wheezing rales or rhonchi is heard.  Normal work of breathing.  No abdominal tenderness, no peritoneal signs.  Negative Homans' sign bilaterally.  Radial pulse 2+ bilaterally.  No chest tenderness. Labs were ordered in triage.  I viewed these results which include no leukocytosis, no anemia, no electrolyte derangement, no  renal insufficiency.  Troponin 5. I viewed pt's chest xray and it does not suggest acute infectious processes.  EKG shows normal sinus rhythm and prolonged QT.  No STEMI.  No prior to compare this to. I  ambulated patient in the emergency department without respiratory distress, tachycardia, or  hypoxia. SpO2 during ambulation >94% on room air.  Engaged in shared decision making regarding further imaging of CTA chest to r/o PE as cause of his chest pain.  Patient's risk factors for PE include recent Covid diagnosis.  He does not want to proceed with CTA of his chest.  He states the chest pain has resolved upon his arrival to the emergency department and he would rather be discharged home with steroids as that has helped him in the past.  I discussed the risks of having a undiagnosed blood clot which patient understands and can repeat back me.  The patient appears reasonably screened and/or stabilized for discharge and I doubt any other medical condition or other [facility] requiring further screening, evaluation, or treatment in the ED at this time prior to discharge. The patient is safe for discharge with strict return precautions discussed. Recommend pcp follow up in 2 days for symptom recheck. Findings and plan of care discussed with supervising physician Dr. Sedonia Small.  Roberto Jenkins was evaluated in Emergency Department on 05/15/2019 for the symptoms described in the history of present illness. He was evaluated in the context of the global COVID-19 pandemic, which necessitated consideration that the patient might be at risk for infection with the SARS-CoV-2 virus that causes COVID-19. Institutional protocols and algorithms that pertain to the evaluation of patients at risk for COVID-19 are in a state of rapid change based on information released by regulatory bodies including the CDC and federal and state organizations. These policies and algorithms were followed during the patient's care in the ED.   Portions of this note were generated with [employer]. Dictation errors may occur despite best attempts at proofreading.    Final Clinical Impression(s) / ED Diagnoses Final diagnoses:  Atypical chest pain    Rx / DC Orders ED Discharge Orders         Ordered    predniSONE (STERAPRED  UNI-PAK 21 TAB) 10 MG (21) TBPK tablet  Daily     05/15/19 1724           Cherre Robins, PA-C 05/15/19 1813    Deno Etienne, DO 05/15/19 1846

## 2019-05-15 NOTE — Telephone Encounter (Signed)
Spoke with patient. Patient reports he had covid on 04/06/2019 and has been having residual side effects. He has an appt with Rosaria Ferries on 06/05/19. Patient reports he has been having chest pain off and on except today it has been constant. He reports the pain as an "elephant sitting on his chest." He reports some dizziness and lightheadedness with the pressure. He reports he also has chest tightness and difficulty breathing with the pressure. Patient advised to proceed directly to the emergency room for evaluation of chest pain. Patient verbalized understanding.

## 2019-05-15 NOTE — ED Triage Notes (Signed)
Pt here for chest tightness and heaviness. Sts pain has been intermittent since he had covid one month ago, but the pain does not normally last this long. Endorses dizziness this morning. Pain worse with deep breaths.

## 2019-05-15 NOTE — Telephone Encounter (Signed)
Pt c/o of Chest Pain: STAT if CP now or developed within 24 hours  1. Are you having CP right now? Yes all day  2. Are you experiencing any other symptoms (ex. SOB, nausea, vomiting, sweating)? no  3. How long have you been experiencing CP? Since 04/16/2019  4. Is your CP continuous or coming and going?  Comes and goes  5. Have you taken Nitroglycerin? No  Patient states after he got covid in February he started having chest tightness. He has an appointment with Rosaria Ferries, but would like to know if he needs to be seen sooner.

## 2019-05-15 NOTE — Discharge Instructions (Addendum)
You have been seen today for chest pain. Please read and follow all provided instructions. Return to the emergency room for worsening condition or new concerning symptoms including worsening chest pain, chest pain with exertion, becoming sweaty with chest pain, shortness of breath or difficulty breathing.  1. Medications:  Prescription sent to your pharmacy for a steroid Dosepak.  Please take as prescribed. -Recommend you take Tylenol as needed for pain.  Please take as directed on the bottle.  Continue usual home medications Take medications as prescribed. Please review all of the medicines and only take them if you do not have an allergy to them.   2. Treatment: rest, drink plenty of fluids  3. Follow Up:  Please follow up with primary care provider by scheduling an appointment as soon as possible for a visit  -Call your primary care provider tomorrow and try to schedule a visit to be rechecked, even if it is just a telemedicine visit that would be helpful.   It is also a possibility that you have an allergic reaction to any of the medicines that you have been prescribed - Everybody reacts differently to medications and while MOST people have no trouble with most medicines, you may have a reaction such as nausea, vomiting, rash, swelling, shortness of breath. If this is the case, please stop taking the medicine immediately and contact your physician.  ?

## 2019-06-05 ENCOUNTER — Ambulatory Visit (INDEPENDENT_AMBULATORY_CARE_PROVIDER_SITE_OTHER): Payer: 59 | Admitting: Physician Assistant

## 2019-06-05 ENCOUNTER — Other Ambulatory Visit: Payer: Self-pay

## 2019-06-05 ENCOUNTER — Encounter: Payer: Self-pay | Admitting: Physician Assistant

## 2019-06-05 VITALS — BP 136/74 | HR 71 | Ht 71.0 in | Wt 176.0 lb

## 2019-06-05 DIAGNOSIS — U071 COVID-19: Secondary | ICD-10-CM | POA: Diagnosis not present

## 2019-06-05 DIAGNOSIS — R079 Chest pain, unspecified: Secondary | ICD-10-CM | POA: Diagnosis not present

## 2019-06-05 MED ORDER — METOPROLOL TARTRATE 100 MG PO TABS: ORAL_TABLET | ORAL | 0 refills | Status: DC

## 2019-06-05 NOTE — Patient Instructions (Signed)
Medication Instructions:  Your physician recommends that you continue on your current medications as directed. Please refer to the Current Medication list given to you today.  *If you need a refill on your cardiac medications before your next appointment, please call your pharmacy*   Lab Work: Your physician recommends that you return for lab work within 1 week of scheduled Cardiac CTA  If you have labs (blood work) drawn today and your tests are completely normal, you will receive your results only by: Marland Kitchen MyChart Message (if you have MyChart) OR . A paper copy in the mail If you have any lab test that is abnormal or we need to change your treatment, we will call you to review the results.   Testing/Procedures: Your cardiac CT will be scheduled at one of the below locations:   [facility] [address] [place], [place] [address] [phone]  [place] [address] [place], [place] [address] [phone]  If scheduled at [facility], please arrive at the [facility] main entrance of [facility] 30 minutes prior to test start time. Proceed to the [facility] Radiology Department (first floor) to check-in and test prep.  If scheduled at [facility], please arrive 15 mins early for check-in and test prep.  Please follow these instructions carefully (unless otherwise directed):  Hold all erectile dysfunction medications at least 3 days (72 hrs) prior to test.  On the Night Before the Test: . Be sure to Drink plenty of water. . Do not consume any caffeinated/decaffeinated beverages or chocolate 12 hours prior to your test. . Do not take any antihistamines 12 hours prior to your test. . If you take Metformin do not take 24 hours prior to test. . If the patient has contrast allergy: ? Patient will need a prescription for Prednisone and very clear instructions (as  follows): 1. Prednisone 50 mg - take 13 hours prior to test 2. Take another Prednisone 50 mg 7 hours prior to test 3. Take another Prednisone 50 mg 1 hour prior to test 4. Take Benadryl 50 mg 1 hour prior to test . Patient must complete all four doses of above prophylactic medications. . Patient will need a ride after test due to Benadryl.  On the Day of the Test: . Drink plenty of water. Do not drink any water within one hour of the test. . Do not eat any food 4 hours prior to the test. . You may take your regular medications prior to the test.  . Take metoprolol (Lopressor) two hours prior to test.       After the Test: . Drink plenty of water. . After receiving IV contrast, you may experience a mild flushed feeling. This is normal. . On occasion, you may experience a mild rash up to 24 hours after the test. This is not dangerous. If this occurs, you can take Benadryl 25 mg and increase your fluid intake. . If you experience trouble breathing, this can be serious. If it is severe call 911 IMMEDIATELY. If it is mild, please call our office. . If you take any of these medications: Glipizide/Metformin, Avandament, Glucavance, please do not take 48 hours after completing test unless otherwise instructed.   Once we have confirmed authorization from your insurance company, we will call you to set up a date and time for your test.   For non-scheduling related questions, please contact the cardiac imaging nurse  navigator should you have any questions/concerns: Marchia Bond, RN Navigator Cardiac Imaging [facility] [phone] office  For scheduling needs, including cancellations and rescheduling, please call [phone].      Follow-Up: At [facility], you and your health needs are our priority.  As part of our continuing mission to provide you with exceptional heart care, we have created designated Provider Care Teams.  These Care Teams include your  primary Cardiologist (physician) and Advanced Practice Providers (APPs -  Physician Assistants and Nurse Practitioners) who all work together to provide you with the care you need, when you need it.  We recommend signing up for the patient portal called "MyChart".  Sign up information is provided on this After Visit Summary.  MyChart is used to connect with patients for Virtual Visits (Telemedicine).  Patients are able to view lab/test results, encounter notes, upcoming appointments, etc.  Non-urgent messages can be sent to your provider as well.   To learn more about what you can do with MyChart, go to [email or web address].    Your next appointment:   2-3 month(s)  The format for your next appointment:   In Person  Provider:   Minus Breeding, MD

## 2019-06-05 NOTE — Progress Notes (Signed)
Cardiology Office Note   Date:  06/05/2019   ID:  Roberto Jenkins, DOB [birth year], MRN [id number]  PCP:  Binnie Rail, MD Cardiologist:  Minus Breeding, MD 02/17/2017  Electrphysiologist: None Rosaria Ferries, PA-C   No chief complaint on file.   History of Present Illness: Roberto Jenkins is a 59 y.o. male with a history of palpitations/dizziness 2018 w/ nl ETT, asthma, childhood SEM, COVID 04/09/2019  ER visit 05/15/2019 for CP at rest, trop and other labs ok>>appt made, ECG w/ prolonged QTc at 508 ms, but no ischemic changes  Roberto Jenkins presents for cardiology follow up.   He is still having problems with more strenuous activity, such as moving furniture. This week not as bad as a month ago. Still not back to baseline.   COVID sx include the SOB, loss of taste, worsened tinnitus, brain fog. These sx have lingered at some level, even to now. He recently completed a 2nd round of steroids. High-dose taper, with improvement in his breathing.   He has had pressure at the lower part of his sternum intermittently since COVID. The worst episode was the day he called in, 6-7/10. Episodes can last minutes to hours. At least once a day. No change with deep inspiration, not positional. Episodes seemed to last longer with exertion. Many started at rest. Since completing the steroids, he has had some episodes, but less frequent and all were mild.   He has multiple MS chronic pain issues, mainly knees and back.   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  He has not had consistent chest pain with exertion.  He does not wake with lower extremity edema, no orthopnea or PND.  He has some dyspnea on exertion, but that has actually been improving slowly since Covid.  He has not had sustained palpitations, his heart will occasionally skip.  No presyncope or syncope.   Past Medical History:  Diagnosis Date  .  Asthma   . Heart murmur    childhood  . Hx of adenomatous polyp of colon 12/02/2014    Past Surgical History:  Procedure Laterality Date  . COLONOSCOPY  2004   in [place];pt doesn't know MD name.  Marland Kitchen HERNIA REPAIR Bilateral   . NOSE SURGERY     x2    Current Outpatient Medications  Medication Sig Dispense Refill  . acetaminophen (TYLENOL) 500 MG tablet Take 500-1,000 mg by mouth every 6 (six) hours as needed for mild pain or headache.    . albuterol (VENTOLIN HFA) 108 (90 Base) MCG/ACT inhaler Inhale 2 puffs into the lungs every 6 (six) hours as needed for wheezing or shortness of breath (or chest tightness).     Marland Kitchen amLODipine (NORVASC) 5 MG tablet TAKE 1 TABLET(5 MG) BY MOUTH DAILY (Patient taking differently: Take 5 mg by mouth at bedtime. ) 90 tablet 0  . benzonatate (TESSALON) 100 MG capsule Take 1-2 capsules (100-200 mg total) by mouth 3 (three) times daily as needed. 60 capsule 0  . cetirizine (ZYRTEC) 10 MG tablet Take 10 mg by mouth at bedtime.     Marland Kitchen levocetirizine (XYZAL) 5 MG tablet Take 1 tablet (5 mg total) by mouth every evening. 30 tablet 3  . montelukast (SINGULAIR) 10 MG tablet Take 1 tablet (10 mg total) by mouth at bedtime. 30 tablet 1  . predniSONE (STERAPRED UNI-PAK 21 TAB)  10 MG (21) TBPK tablet Take by mouth daily. Take 6 tabs by mouth daily  for 2 days, then 5 tabs for 2 days, then 4 tabs for 2 days, then 3 tabs for 2 days, 2 tabs for 2 days, then 1 tab by mouth daily for 2 days 42 tablet 0  . promethazine-dextromethorphan (PROMETHAZINE-DM) 6.25-15 MG/5ML syrup Take 5 mLs by mouth at bedtime as needed for cough. 100 mL 0  . SYMBICORT 160-4.5 MCG/ACT inhaler INHALE 2 PUFFS INTO THE LUNGS TWICE DAILY (Patient taking differently: Inhale 2 puffs into the lungs in the morning and at bedtime. ) 10.2 g 0  . triamcinolone (NASACORT) 55 MCG/ACT AERO nasal inhaler Place 2 sprays into the nose daily. 1 Inhaler 6  . metoprolol tartrate (LOPRESSOR) 100 MG tablet Take 1 tablet  by mouth two hours prior to CTA. 1 tablet 0   No current facility-administered medications for this visit.    Allergies:   Montelukast    Social History:  The patient  reports that he has been smoking cigarettes. He has a 38.00 pack-year smoking history. He has never used smokeless tobacco. He reports current alcohol use of about 5.0 standard drinks of alcohol per week. He reports current drug use. Drugs: Amphetamines, LSD, and Other-see comments.   Family History:  The patient's He was adopted. Family history is unknown by patient.  is adopted.     ROS:  Please see the history of present illness. All other systems are reviewed and negative.    PHYSICAL EXAM: VS:  BP 136/74   Pulse 71   Ht 5\' 11"  (1.803 m)   Wt 176 lb (79.8 kg)   BMI 24.55 kg/m  , BMI Body mass index is 24.55 kg/m. GEN: Well nourished, well developed, male in no acute distress HEENT: normal for age  Neck: no JVD, no carotid bruit, no masses Cardiac: RRR; no murmur, no rubs, or gallops Respiratory:  clear to auscultation bilaterally, normal work of breathing GI: soft, nontender, nondistended, + BS MS: no deformity or atrophy; no edema; distal pulses are 2+ in all 4 extremities  Skin: warm and dry, no rash Neuro:  Strength and sensation are intact Psych: euthymic mood, full affect   EKG:  EKG is ordered today. The ekg ordered today demonstrates sinus rhythm, heart rate 71, left anterior fascicular block, QTC is 467 ms, no acute ischemic changes.  No pathologic Q waves.  ETT: 03/14/2017  Blood pressure demonstrated a normal response to exercise.  There was no ST segment deviation noted during stress.   No ischemia. Excellent exercise capacity.  Normal BP response to exertion.    Recent Labs: 05/15/2019: BUN 7; Creatinine, Ser 0.74; Hemoglobin 16.8; Platelets 231; Potassium 4.5; Sodium 140  CBC    Component Value Date/Time   WBC 7.5 05/15/2019 1413   RBC 5.40 05/15/2019 1413   HGB 16.8 05/15/2019  1413   HCT 49.8 05/15/2019 1413   PLT 231 05/15/2019 1413   MCV 92.2 05/15/2019 1413   MCH 31.1 05/15/2019 1413   MCHC 33.7 05/15/2019 1413   RDW 13.6 05/15/2019 1413   LYMPHSABS 2.2 03/28/2017 1256   MONOABS 0.6 03/28/2017 1256   EOSABS 0.2 03/28/2017 1256   BASOSABS 0.1 03/28/2017 1256   CMP Latest Ref Rng & Units 05/15/2019 06/22/2017 03/28/2017  Glucose 70 - 99 mg/dL 91 95 80  BUN 6 - 20 mg/dL 7 7 11   Creatinine 0.61 - 1.24 mg/dL 0.74 0.68 0.68  Sodium 135 - 145 mmol/L  140 139 142  Potassium 3.5 - 5.1 mmol/L 4.5 4.0 3.8  Chloride 98 - 111 mmol/L 102 104 105  CO2 22 - 32 mmol/L 24 29 32  Calcium 8.9 - 10.3 mg/dL 9.5 9.0 9.1  Total Protein 6.0 - 8.3 g/dL - 6.4 6.6  Total Bilirubin 0.2 - 1.2 mg/dL - 0.4 0.8  Alkaline Phos 39 - 117 U/L - 66 55  AST 0 - 37 U/L - 16 11  ALT 0 - 53 U/L - 13 12     Lipid Panel Lab Results  Component Value Date   CHOL 201 (H) 02/08/2017   HDL 129.70 02/08/2017   LDLCALC 64 02/08/2017   TRIG 36.0 02/08/2017   CHOLHDL 2 02/08/2017      Wt Readings from Last 3 Encounters:  06/05/19 176 lb (79.8 kg)  05/15/19 181 lb (82.1 kg)  02/01/18 169 lb (76.7 kg)     Other studies Reviewed: Additional studies/ records that were reviewed today include: Office notes, hospital records and testing.  ASSESSMENT AND PLAN:  1.  Chest pain: -His ECG from the day he went to the hospital has questionable inferior ST depression.  This is mild, seen in leads II and III.  Not seen today -I would do another treadmill, but we are not able to do those because of Covid.  I would also prefer to have some sort of imaging. -I feel the best test to definitively rule out coronary artery disease is cardiac CT.  Hopefully, this will be normal and CAD will be definitively ruled out.  2.  History of Covid with residual symptoms: -His dyspnea on exertion has been gradually improving. -His sense of taste is coming back, but is also not fully recovered. -He is encouraged to  keep working and exercising to get his breathing better   Current medicines are reviewed at length with the patient today.  The patient does not have concerns regarding medicines.  The following changes have been made:  no change  Labs/ tests ordered today include:   Orders Placed This Encounter  Procedures  . CT CORONARY MORPH W/CTA COR W/SCORE W/CA W/CM &/OR WO/CM  . CT CORONARY FRACTIONAL FLOW RESERVE DATA PREP  . CT CORONARY FRACTIONAL FLOW RESERVE FLUID ANALYSIS  . Basic metabolic panel  . EKG 12-Lead     Disposition:   FU with Minus Breeding, MD  Signed, Rosaria Ferries, PA-C  06/05/2019 3:20 PM    [place] Phone: [phone]; Fax: [phone]

## 2019-07-05 ENCOUNTER — Telehealth (HOSPITAL_COMMUNITY): Payer: Self-pay | Admitting: *Deleted

## 2019-07-05 NOTE — Telephone Encounter (Signed)
Pt returned call and question regarding cardiac CT answered.  Pt expressed understanding of instructions.  Burley Saver, RN 07/05/19

## 2019-07-05 NOTE — Telephone Encounter (Signed)
Attempted to call patient regarding upcoming cardiac CT appointment. Left message on voicemail with name and callback number Jrake Rodriquez Tai RN Navigator Cardiac Imaging [facility] [phone] Office [phone] Cell 

## 2019-07-08 ENCOUNTER — Ambulatory Visit (HOSPITAL_COMMUNITY)
Admission: RE | Admit: 2019-07-08 | Discharge: 2019-07-08 | Disposition: A | Discharge status: Home / Self Care | Payer: 59 | Source: Ambulatory Visit | Attending: Physician Assistant | Admitting: Physician Assistant

## 2019-07-08 ENCOUNTER — Other Ambulatory Visit: Payer: Self-pay

## 2019-07-08 DIAGNOSIS — R079 Chest pain, unspecified: Secondary | ICD-10-CM | POA: Diagnosis not present

## 2019-07-08 MED ORDER — NITROGLYCERIN 0.4 MG SL SUBL
SUBLINGUAL_TABLET | SUBLINGUAL | Status: AC
  Filled 2019-07-08: qty 2

## 2019-07-08 MED ORDER — IOHEXOL 350 MG/ML SOLN
80.0000 mL | Freq: Once | INTRAVENOUS | Status: AC | PRN
  Administered 2019-07-08: 15:00:00 80 mL via INTRAVENOUS

## 2019-07-08 MED ORDER — NITROGLYCERIN 0.4 MG SL SUBL
0.8000 mg | SUBLINGUAL_TABLET | Freq: Once | SUBLINGUAL | Status: AC
  Administered 2019-07-08: 15:00:00 0.8 mg via SUBLINGUAL

## 2019-07-19 ENCOUNTER — Telehealth: Payer: Self-pay | Admitting: General Practice

## 2019-07-19 ENCOUNTER — Other Ambulatory Visit: Payer: Self-pay

## 2019-07-19 NOTE — Telephone Encounter (Signed)
He is not our patient. He needs to establish care with a new PCP.

## 2019-07-19 NOTE — Telephone Encounter (Signed)
New message:    Pt is calling and states he needs the generic version of SYMBICORT 160-4.5 MCG/ACT inhaler due to insurance not willing to pay for it. He states he believes they will pay for the generic. Please advise.

## 2019-08-12 ENCOUNTER — Encounter: Payer: Self-pay | Admitting: Cardiology

## 2019-08-12 NOTE — Progress Notes (Deleted)
Cardiology Office Note   Date:  08/12/2019   ID:  Roberto Jenkins, DOB 1960/07/09, MRN [id number]  PCP:  Patient, No Pcp Per  Cardiologist:   Minus Breeding, MD Referring:  ***  No chief complaint on file.     History of Present Illness: Roberto Jenkins is a 59 y.o. male who presents for follow up of chest pain and palpitations.   Coronary calcium score was zero and he had no CAD on CT.    He had a negative POET (Plain Old Exercise Treadmill) in 2019.   ***    *** Roberto Jenkins is a 59 y.o. male with a history of palpitations/dizziness 2018 w/ nl ETT, asthma, childhood SEM, COVID 04/09/2019  ER visit 05/15/2019 for CP at rest, trop and other labs ok>>appt made, ECG w/ prolonged QTc at 508 ms, but no ischemic changes  Roberto Jenkins presents for cardiology follow up.   He is still having problems with more strenuous activity, such as moving furniture. This week not as bad as a month ago. Still not back to baseline.   COVID sx include the SOB, loss of taste, worsened tinnitus, brain fog. These sx have lingered at some level, even to now. He recently completed a 2nd round of steroids. High-dose taper, with improvement in his breathing.   He has had pressure at the lower part of his sternum intermittently since COVID. The worst episode was the day he called in, 6-7/10. Episodes can last minutes to hours. At least once a day. No change with deep inspiration, not positional. Episodes seemed to last longer with exertion. Many started at rest. Since completing the steroids, he has had some episodes, but less frequent and all were mild.   He has multiple MS chronic pain issues, mainly knees and back.   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  He has not had consistent chest pain with exertion.  He does not wake with lower extremity edema, no orthopnea or PND.  He has some dyspnea on exertion, but  that has actually been improving slowly since Covid.  He has not had sustained palpitations, his heart will occasionally skip.  No presyncope or syncope.    Past Medical History:  Diagnosis Date  . Asthma   . Heart murmur    childhood  . Hx of adenomatous polyp of colon 12/02/2014    Past Surgical History:  Procedure Laterality Date  . COLONOSCOPY  2004   in [place];pt doesn't know MD name.  Marland Kitchen HERNIA REPAIR Bilateral   . NOSE SURGERY     x2     Current Outpatient Medications  Medication Sig Dispense Refill  . acetaminophen (TYLENOL) 500 MG tablet Take 500-1,000 mg by mouth every 6 (six) hours as needed for mild pain or headache.    . albuterol (VENTOLIN HFA) 108 (90 Base) MCG/ACT inhaler Inhale 2 puffs into the lungs every 6 (six) hours as needed for wheezing or shortness of breath (or chest tightness).     Marland Kitchen amLODipine (NORVASC) 5 MG tablet TAKE 1 TABLET(5 MG) BY MOUTH DAILY (Patient taking differently: Take 5 mg by mouth at bedtime. ) 90 tablet 0  . benzonatate (TESSALON) 100 MG capsule Take 1-2 capsules (100-200 mg total) by mouth 3 (three) times daily as needed. 60 capsule 0  . cetirizine (ZYRTEC) 10 MG tablet Take 10 mg  by mouth at bedtime.     Marland Kitchen levocetirizine (XYZAL) 5 MG tablet Take 1 tablet (5 mg total) by mouth every evening. 30 tablet 3  . metoprolol tartrate (LOPRESSOR) 100 MG tablet Take 1 tablet by mouth two hours prior to CTA. 1 tablet 0  . montelukast (SINGULAIR) 10 MG tablet Take 1 tablet (10 mg total) by mouth at bedtime. 30 tablet 1  . predniSONE (STERAPRED UNI-PAK 21 TAB) 10 MG (21) TBPK tablet Take by mouth daily. Take 6 tabs by mouth daily  for 2 days, then 5 tabs for 2 days, then 4 tabs for 2 days, then 3 tabs for 2 days, 2 tabs for 2 days, then 1 tab by mouth daily for 2 days 42 tablet 0  . promethazine-dextromethorphan (PROMETHAZINE-DM) 6.25-15 MG/5ML syrup Take 5 mLs by mouth at bedtime as needed for cough. 100 mL 0  . SYMBICORT 160-4.5 MCG/ACT  inhaler INHALE 2 PUFFS INTO THE LUNGS TWICE DAILY (Patient taking differently: Inhale 2 puffs into the lungs in the morning and at bedtime. ) 10.2 g 0  . triamcinolone (NASACORT) 55 MCG/ACT AERO nasal inhaler Place 2 sprays into the nose daily. 1 Inhaler 6   No current facility-administered medications for this visit.    Allergies:   Montelukast    ROS:  Please see the history of present illness.   Otherwise, review of systems are positive for {NONE DEFAULTED:18576::"none"}.   All other systems are reviewed and negative.    PHYSICAL EXAM: VS:  There were no vitals taken for this visit. , BMI There is no height or weight on file to calculate BMI. GENERAL:  Well appearing NECK:  No jugular venous distention, waveform within normal limits, carotid upstroke brisk and symmetric, no bruits, no thyromegaly LUNGS:  Clear to auscultation bilaterally CHEST:  Unremarkable HEART:  PMI not displaced or sustained,S1 and S2 within normal limits, no S3, no S4, no clicks, no rubs, *** murmurs ABD:  Flat, positive bowel sounds normal in frequency in pitch, no bruits, no rebound, no guarding, no midline pulsatile mass, no hepatomegaly, no splenomegaly EXT:  2 plus pulses throughout, no edema, no cyanosis no clubbing    ***GENERAL:  Well appearing HEENT:  Pupils equal round and reactive, fundi not visualized, oral mucosa unremarkable NECK:  No jugular venous distention, waveform within normal limits, carotid upstroke brisk and symmetric, no bruits, no thyromegaly LYMPHATICS:  No cervical, inguinal adenopathy LUNGS:  Clear to auscultation bilaterally BACK:  No CVA tenderness CHEST:  Unremarkable HEART:  PMI not displaced or sustained,S1 and S2 within normal limits, no S3, no S4, no clicks, no rubs, *** murmurs ABD:  Flat, positive bowel sounds normal in frequency in pitch, no bruits, no rebound, no guarding, no midline pulsatile mass, no hepatomegaly, no splenomegaly EXT:  2 plus pulses throughout, no  edema, no cyanosis no clubbing SKIN:  No rashes no nodules NEURO:  Cranial nerves II through XII grossly intact, motor grossly intact throughout PSYCH:  Cognitively intact, oriented to person place and time    EKG:  EKG {ACTION; IS/IS GUY:[id number]} ordered today. The ekg ordered today demonstrates ***   Recent Labs: 05/15/2019: BUN 7; Creatinine, Ser 0.74; Hemoglobin 16.8; Platelets 231; Potassium 4.5; Sodium 140    Lipid Panel    Component Value Date/Time   CHOL 201 (H) 02/08/2017 1615   TRIG 36.0 02/08/2017 1615   HDL 129.70 02/08/2017 1615   CHOLHDL 2 02/08/2017 1615   VLDL 7.2 02/08/2017 1615   LDLCALC 64 02/08/2017  1615      Wt Readings from Last 3 Encounters:  06/05/19 176 lb (79.8 kg)  05/15/19 181 lb (82.1 kg)  02/01/18 169 lb (76.7 kg)      Other studies Reviewed: Additional studies/ records that were reviewed today include: ***. Review of the above records demonstrates:  Please see elsewhere in the note.  ***   ASSESSMENT AND PLAN:  CHEST PAIN:  ***  PALPITATIONS:  ***     Current medicines are reviewed at length with the patient today.  The patient {ACTIONS; HAS/DOES NOT HAVE:19233} concerns regarding medicines.  The following changes have been made:  {PLAN; NO CHANGE:13088:s}  Labs/ tests ordered today include: *** No orders of the defined types were placed in this encounter.    Disposition:   FU with ***    Signed, Minus Breeding, MD  08/12/2019 8:18 PM    [facility]

## 2019-08-13 ENCOUNTER — Ambulatory Visit: Payer: 59 | Admitting: Cardiology

## 2019-08-22 DIAGNOSIS — R002 Palpitations: Secondary | ICD-10-CM | POA: Insufficient documentation

## 2019-08-22 DIAGNOSIS — Z7189 Other specified counseling: Secondary | ICD-10-CM | POA: Insufficient documentation

## 2019-08-22 DIAGNOSIS — R079 Chest pain, unspecified: Secondary | ICD-10-CM | POA: Insufficient documentation

## 2019-08-22 NOTE — Progress Notes (Signed)
Cardiology Office Note   Date:  08/23/2019   ID:  Roberto Jenkins, DOB 06-07-1960, MRN [id number]  PCP:  Patient, No Pcp Per  Cardiologist:   Minus Breeding, MD   Chief Complaint  Patient presents with   Palpitations   Chest Pain      History of Present Illness: Roberto Jenkins is a 59 y.o. male who presents for follow up of chest pain and palpitations.   Coronary calcium score was zero and he had no CAD on CT.    He had a negative POET (Plain Old Exercise Treadmill) in 2019.     We saw him in follow-up.  He is having some continued chest discomfort.  He was continuing to have some shortness of breath.  He thinks the chest discomfort and shortness of breath is slowly gotten better.  He was in the emergency room in March following the Covid hospitalization in February.  I do note that a high-sensitivity troponin was negative.  He still having lots of problems with some brain fog.  He is having joint pain.  He did have a course of steroids at one point.  He says his breathing is not quite back to baseline.  Has been out of his Symbicort.  His biggest complaint is probably tendinitis.  Past Medical History:  Diagnosis Date   Asthma    Hx of adenomatous polyp of colon 12/02/2014    Past Surgical History:  Procedure Laterality Date   COLONOSCOPY  2004   in [place];pt doesn't know MD name.   HERNIA REPAIR Bilateral    NOSE SURGERY     x2     Current Outpatient Medications  Medication Sig Dispense Refill   acetaminophen (TYLENOL) 500 MG tablet Take 500-1,000 mg by mouth every 6 (six) hours as needed for mild pain or headache.     albuterol (VENTOLIN HFA) 108 (90 Base) MCG/ACT inhaler Inhale 2 puffs into the lungs every 6 (six) hours as needed for wheezing or shortness of breath (or chest tightness).      amLODipine (NORVASC) 5 MG tablet TAKE 1 TABLET(5 MG) BY MOUTH DAILY (Patient taking differently: Take 5 mg by mouth at bedtime. ) 90 tablet 0   benzonatate  (TESSALON) 100 MG capsule Take 1-2 capsules (100-200 mg total) by mouth 3 (three) times daily as needed. 60 capsule 0   cetirizine (ZYRTEC) 10 MG tablet Take 10 mg by mouth at bedtime.      levocetirizine (XYZAL) 5 MG tablet Take 1 tablet (5 mg total) by mouth every evening. 30 tablet 3   metoprolol tartrate (LOPRESSOR) 100 MG tablet Take 1 tablet by mouth two hours prior to CTA. 1 tablet 0   montelukast (SINGULAIR) 10 MG tablet Take 1 tablet (10 mg total) by mouth at bedtime. 30 tablet 1   predniSONE (STERAPRED UNI-PAK 21 TAB) 10 MG (21) TBPK tablet Take by mouth daily. Take 6 tabs by mouth daily  for 2 days, then 5 tabs for 2 days, then 4 tabs for 2 days, then 3 tabs for 2 days, 2 tabs for 2 days, then 1 tab by mouth daily for 2 days 42 tablet 0   promethazine-dextromethorphan (PROMETHAZINE-DM) 6.25-15 MG/5ML syrup Take 5 mLs by mouth at bedtime as needed for cough. 100 mL 0   triamcinolone (NASACORT) 55 MCG/ACT AERO nasal inhaler Place 2 sprays into the nose daily. 1 Inhaler 6   budesonide-formoterol (SYMBICORT) 160-4.5 MCG/ACT inhaler Inhale 2 puffs into the lungs 2 (  two) times daily. 1 Inhaler 2   No current facility-administered medications for this visit.    Allergies:   Montelukast    ROS:  Please see the history of present illness.   Otherwise, review of systems are positive for none.   All other systems are reviewed and negative.    PHYSICAL EXAM: VS:  BP 120/82    Pulse 74    Temp (!) 96.8 F (36 C)    Ht 5\' 11"  (1.803 m)    Wt 191 lb (86.6 kg)    SpO2 96%    BMI 26.64 kg/m  , BMI Body mass index is 26.64 kg/m. GENERAL:  Well appearing NECK:  No jugular venous distention, waveform within normal limits, carotid upstroke brisk and symmetric, no bruits, no thyromegaly LUNGS:  Clear to auscultation bilaterally CHEST:  Unremarkable HEART:  PMI not displaced or sustained,S1 and S2 within normal limits, no S3, no S4, no clicks, no rubs, no murmurs ABD:  Flat, positive bowel  sounds normal in frequency in pitch, no bruits, no rebound, no guarding, no midline pulsatile mass, no hepatomegaly, no splenomegaly EXT:  2 plus pulses throughout, no edema, no cyanosis no clubbing   EKG:  EKG is not ordered today.   Recent Labs: 05/15/2019: BUN 7; Creatinine, Ser 0.74; Hemoglobin 16.8; Platelets 231; Potassium 4.5; Sodium 140    Lipid Panel    Component Value Date/Time   CHOL 201 (H) 02/08/2017 1615   TRIG 36.0 02/08/2017 1615   HDL 129.70 02/08/2017 1615   CHOLHDL 2 02/08/2017 1615   VLDL 7.2 02/08/2017 1615   LDLCALC 64 02/08/2017 1615      Wt Readings from Last 3 Encounters:  08/23/19 191 lb (86.6 kg)  06/05/19 176 lb (79.8 kg)  05/15/19 181 lb (82.1 kg)      Other studies Reviewed: Additional studies/ records that were reviewed today include: ED records. Review of the above records demonstrates:  Please see elsewhere in the note.     ASSESSMENT AND PLAN:  CHEST PAIN:    His chest pain is slowly improved.  He had a negative troponin when he was seen in March.  He had previous negative work-up.  At this point since his symptoms are improving I would not suggest further cardiovascular testing.  PALPITATIONS: He is having these infrequently but they are improving.  He can let me know if they worsen.  No further cardiovascular testing at this point.    COVID EDUCATION: He has had his vaccine.   Current medicines are reviewed at length with the patient today.  The patient does not have concerns regarding medicines.  The following changes have been made:  no change  Labs/ tests ordered today include: None No orders of the defined types were placed in this encounter.    Disposition:   FU with me as needed.     Signed, Minus Breeding, MD  08/23/2019 2:38 PM    [facility]

## 2019-08-23 ENCOUNTER — Encounter: Payer: Self-pay | Admitting: Cardiology

## 2019-08-23 ENCOUNTER — Ambulatory Visit (INDEPENDENT_AMBULATORY_CARE_PROVIDER_SITE_OTHER): Payer: 59 | Admitting: Cardiology

## 2019-08-23 ENCOUNTER — Other Ambulatory Visit: Payer: Self-pay

## 2019-08-23 VITALS — BP 120/82 | HR 74 | Temp 96.8°F | Ht 71.0 in | Wt 191.0 lb

## 2019-08-23 DIAGNOSIS — R079 Chest pain, unspecified: Secondary | ICD-10-CM

## 2019-08-23 DIAGNOSIS — R002 Palpitations: Secondary | ICD-10-CM | POA: Diagnosis not present

## 2019-08-23 DIAGNOSIS — Z7189 Other specified counseling: Secondary | ICD-10-CM

## 2019-08-23 MED ORDER — BUDESONIDE-FORMOTEROL FUMARATE 160-4.5 MCG/ACT IN AERO: 2.0000 | INHALATION_SPRAY | Freq: Two times a day (BID) | RESPIRATORY_TRACT | 2 refills | Status: DC

## 2019-08-23 NOTE — Patient Instructions (Signed)
Medication Instructions:  Your physician recommends that you continue on your current medications as directed. Please refer to the Current Medication list given to you today.  Lab Work: NONE   Testing/Procedures: NONE   Follow-Up: AS NEEDED  

## 2020-06-15 ENCOUNTER — Telehealth: Payer: Self-pay

## 2020-06-15 NOTE — Telephone Encounter (Signed)
Patient has not been seen since 03.09.2019, is it okay to re-establish care?

## 2020-06-16 NOTE — Telephone Encounter (Signed)
Yes, I can accept him.

## 2020-06-17 NOTE — Telephone Encounter (Signed)
LVM for patient to make an appointment

## 2020-06-23 ENCOUNTER — Other Ambulatory Visit: Payer: Self-pay

## 2020-06-23 ENCOUNTER — Telehealth: Payer: Self-pay

## 2020-06-23 DIAGNOSIS — R030 Elevated blood-pressure reading, without diagnosis of hypertension: Secondary | ICD-10-CM

## 2020-06-23 MED ORDER — AMLODIPINE BESYLATE 5 MG PO TABS: ORAL_TABLET | ORAL | 0 refills | Status: DC

## 2020-06-23 NOTE — Telephone Encounter (Signed)
Yes, ok 

## 2020-06-23 NOTE — Telephone Encounter (Signed)
Patient is requesting a refill on amLODipine (NORVASC) 5 MG tablet. It can be sent to [place], [address] AT [address]. He is re establishing care to Dr. Quay Burow on 07-17-20. Please advise. He said that he only has 4 pills left

## 2020-06-23 NOTE — Telephone Encounter (Signed)
Faxed in today. 

## 2020-07-16 ENCOUNTER — Other Ambulatory Visit: Payer: Self-pay

## 2020-07-16 NOTE — Progress Notes (Signed)
Subjective:    Patient ID: Roberto Jenkins, male    DOB: February 13, 1961, 60 y.o.   MRN: [id number]  HPI  He is here to establish with a new pcp.    The patient is here for follow up of their chronic medical problems, including htn, copd  He is smoking some - not regularly.  He is drinking less alcohol than he was previously.  He has no concerns and overall feels he is doing well.  He is experiencing a lot of allergies.  Medications and allergies reviewed with patient and updated if appropriate.  Patient Active Problem List   Diagnosis Date Noted  . Allergic rhinitis 07/17/2020  . Palpitations 08/22/2019  . Mild persistent asthma without complication 13/09/6576  . Chronic obstructive pulmonary disease ([place]) 06/22/2017  . Fatty liver 03/28/2017  . Hypertension 02/08/2017  . Alcohol consumption heavy 02/08/2017  . Erectile dysfunction 02/05/2015  . Hx of adenomatous polyp of colon 12/02/2014  . Hemorrhoid 09/26/2014  . Low back pain 09/26/2014    Current Outpatient Medications on File Prior to Visit  Medication Sig Dispense Refill  . albuterol (VENTOLIN HFA) 108 (90 Base) MCG/ACT inhaler Inhale 2 puffs into the lungs every 6 (six) hours as needed for wheezing or shortness of breath (or chest tightness).     Marland Kitchen amLODipine (NORVASC) 5 MG tablet TAKE 1 TABLET(5 MG) BY MOUTH DAILY 90 tablet 0  . cetirizine (ZYRTEC) 10 MG tablet Take 10 mg by mouth at bedtime.     Marland Kitchen levocetirizine (XYZAL) 5 MG tablet Take 1 tablet (5 mg total) by mouth every evening. 30 tablet 3  . triamcinolone (NASACORT) 55 MCG/ACT AERO nasal inhaler Place 2 sprays into the nose daily. 1 Inhaler 6   No current facility-administered medications on file prior to visit.    Past Medical History:  Diagnosis Date  . Asthma   . Hx of adenomatous polyp of colon 12/02/2014    Past Surgical History:  Procedure Laterality Date  . COLONOSCOPY  2004   in [place];pt doesn't know MD name.  Marland Kitchen HERNIA REPAIR Bilateral    . NOSE SURGERY     x2    Social History   Socioeconomic History  . Marital status: Divorced    Spouse name: Not on file  . Number of children: 1  . Years of education: 22  . Highest education level: Not on file  Occupational History  . Occupation: Bartender  Tobacco Use  . Smoking status: Current Some Day Smoker    Packs/day: 1.00    Years: 38.00    Pack years: 38.00    Types: Cigarettes  . Smokeless tobacco: Never Used  Substance and Sexual Activity  . Alcohol use: Yes    Alcohol/week: 5.0 standard drinks    Types: 5 Shots of liquor per week  . Drug use: Yes    Types: Amphetamines, LSD, Other-see comments    Comment: once every 3 months; occasionally, uses mushrooms.   . Sexual activity: Not on file  Other Topics Concern  . Not on file  Social History Narrative   Fun: Go to shows, occasionally workout.    Denies religious beliefs effecting health care.    Social Determinants of Health   Financial Resource Strain: Not on file  Food Insecurity: Not on file  Transportation Needs: Not on file  Physical Activity: Not on file  Stress: Not on file  Social Connections: Not on file    Family History  Adopted: Yes  Family history unknown: Yes    Review of Systems  Constitutional: Negative for chills and fever.  HENT: Positive for congestion, ear pain (right ear pressure), postnasal drip, rhinorrhea, sneezing and tinnitus (chronic). Negative for sinus pain and sore throat.   Respiratory: Positive for chest tightness (since he had covid), shortness of breath (when his chest tightens up ) and wheezing (infreq). Negative for cough.   Cardiovascular: Negative for chest pain, palpitations and leg swelling.  Gastrointestinal: Negative for abdominal pain, constipation and diarrhea.       Gerd with eating - takes tums  Musculoskeletal: Positive for arthralgias.  Neurological: Positive for headaches. Negative for light-headedness.  Psychiatric/Behavioral: Negative for  dysphoric mood. The patient is not nervous/anxious.        Objective:   Vitals:   07/17/20 1440  BP: 114/74  Pulse: 70  Temp: 98.6 F (37 C)  SpO2: 97%   BP Readings from Last 3 Encounters:  07/17/20 114/74  08/23/19 120/82  07/08/19 118/74   Wt Readings from Last 3 Encounters:  07/17/20 178 lb (80.7 kg)  08/23/19 191 lb (86.6 kg)  06/05/19 176 lb (79.8 kg)   Body mass index is 24.83 kg/m.   Physical Exam    Constitutional: Appears well-developed and well-nourished. No distress.  HENT:  Head: Normocephalic and atraumatic.  Neck: Neck supple. No tracheal deviation present. No thyromegaly present.  No cervical lymphadenopathy Cardiovascular: Normal rate, regular rhythm and normal heart sounds.   No murmur heard. No carotid bruit .  No edema Pulmonary/Chest: Effort normal and breath sounds normal. No respiratory distress. No has no wheezes. No rales.  Abd: soft, NT, ND Skin: Skin is warm and dry. Not diaphoretic.  Psychiatric: Normal mood and affect. Behavior is normal.      Assessment & Plan:    See Problem List for Assessment and Plan of chronic medical problems.    This visit occurred during the SARS-CoV-2 public health emergency.  Safety protocols were in place, including screening questions prior to the visit, additional usage of staff PPE, and extensive cleaning of exam room while observing appropriate contact time as indicated for disinfecting solutions.

## 2020-07-16 NOTE — Patient Instructions (Addendum)
  Blood work was ordered.     Medications changes include :   Retry singulair   Your prescription(s) have been submitted to your pharmacy. Please take as directed and contact our office if you believe you are having problem(s) with the medication(s).     Please followup in 1 year

## 2020-07-17 ENCOUNTER — Encounter: Payer: Self-pay | Admitting: Internal Medicine

## 2020-07-17 ENCOUNTER — Other Ambulatory Visit: Payer: Self-pay

## 2020-07-17 ENCOUNTER — Ambulatory Visit (INDEPENDENT_AMBULATORY_CARE_PROVIDER_SITE_OTHER): Payer: 59 | Admitting: Internal Medicine

## 2020-07-17 VITALS — BP 114/74 | HR 70 | Temp 98.6°F | Ht 71.0 in | Wt 178.0 lb

## 2020-07-17 DIAGNOSIS — R062 Wheezing: Secondary | ICD-10-CM

## 2020-07-17 DIAGNOSIS — Z13220 Encounter for screening for lipoid disorders: Secondary | ICD-10-CM

## 2020-07-17 DIAGNOSIS — J449 Chronic obstructive pulmonary disease, unspecified: Secondary | ICD-10-CM | POA: Diagnosis not present

## 2020-07-17 DIAGNOSIS — J309 Allergic rhinitis, unspecified: Secondary | ICD-10-CM | POA: Insufficient documentation

## 2020-07-17 DIAGNOSIS — Z125 Encounter for screening for malignant neoplasm of prostate: Secondary | ICD-10-CM

## 2020-07-17 DIAGNOSIS — J4530 Mild persistent asthma, uncomplicated: Secondary | ICD-10-CM

## 2020-07-17 DIAGNOSIS — J3089 Other allergic rhinitis: Secondary | ICD-10-CM | POA: Diagnosis not present

## 2020-07-17 DIAGNOSIS — I10 Essential (primary) hypertension: Secondary | ICD-10-CM

## 2020-07-17 LAB — LIPID PANEL
Cholesterol: 214 mg/dL — ABNORMAL HIGH (ref 0–200)
HDL: 88.2 mg/dL (ref >39.00)
LDL Cholesterol: 115 mg/dL — ABNORMAL HIGH (ref 0–99)
NonHDL: 126
Total CHOL/HDL Ratio: 2
Triglycerides: 55 mg/dL (ref 0.0–149.0)
VLDL: 11 mg/dL (ref 0.0–40.0)

## 2020-07-17 LAB — CBC WITH DIFFERENTIAL/PLATELET
Basophils Absolute: 0.1 K/uL (ref 0.0–0.1)
Basophils Relative: 1 % (ref 0.0–3.0)
Eosinophils Absolute: 0.2 K/uL (ref 0.0–0.7)
Eosinophils Relative: 3.4 % (ref 0.0–5.0)
HCT: 46.9 % (ref 39.0–52.0)
Hemoglobin: 15.6 g/dL (ref 13.0–17.0)
Lymphocytes Relative: 27.9 % (ref 12.0–46.0)
Lymphs Abs: 1.8 K/uL (ref 0.7–4.0)
MCHC: 33.2 g/dL (ref 30.0–36.0)
MCV: 91 fl (ref 78.0–100.0)
Monocytes Absolute: 0.7 K/uL (ref 0.1–1.0)
Monocytes Relative: 10.5 % (ref 3.0–12.0)
Neutro Abs: 3.7 K/uL (ref 1.4–7.7)
Neutrophils Relative %: 57.2 % (ref 43.0–77.0)
Platelets: 212 K/uL (ref 150.0–400.0)
RBC: 5.15 Mil/uL (ref 4.22–5.81)
RDW: 13.4 % (ref 11.5–15.5)
WBC: 6.5 K/uL (ref 4.0–10.5)

## 2020-07-17 LAB — COMPREHENSIVE METABOLIC PANEL
ALT: 16 U/L (ref 0–53)
AST: 16 U/L (ref 0–37)
Albumin: 4.7 g/dL (ref 3.5–5.2)
Alkaline Phosphatase: 57 U/L (ref 39–117)
BUN: 11 mg/dL (ref 6–23)
CO2: 30 mEq/L (ref 19–32)
Calcium: 9.7 mg/dL (ref 8.4–10.5)
Chloride: 103 mEq/L (ref 96–112)
Creatinine, Ser: 0.73 mg/dL (ref 0.40–1.50)
GFR: 99 mL/min (ref >60.00)
Glucose, Bld: 95 mg/dL (ref 70–99)
Potassium: 4.4 mEq/L (ref 3.5–5.1)
Sodium: 140 mEq/L (ref 135–145)
Total Bilirubin: 0.7 mg/dL (ref 0.2–1.2)
Total Protein: 6.9 g/dL (ref 6.0–8.3)

## 2020-07-17 MED ORDER — BUDESONIDE-FORMOTEROL FUMARATE 160-4.5 MCG/ACT IN AERO: 2.0000 | INHALATION_SPRAY | Freq: Two times a day (BID) | RESPIRATORY_TRACT | 11 refills | Status: DC

## 2020-07-17 MED ORDER — MONTELUKAST SODIUM 10 MG PO TABS: 10.0000 mg | ORAL_TABLET | Freq: Every day | ORAL | 1 refills | Status: DC

## 2020-07-17 NOTE — Assessment & Plan Note (Signed)
Chronic Encourage smoking cessation-he has cut down Continue Symbicort 160-4.5 mcg per ACT twice daily and albuterol inhaler as needed

## 2020-07-17 NOTE — Assessment & Plan Note (Addendum)
Chronic Blood pressure well controlled Continue amlodipine 5 mg daily CMP, CBC

## 2020-07-17 NOTE — Assessment & Plan Note (Addendum)
Chronic Year-wound Has been bad this year Taking zyrtec and xyzal per his last PCP Uses nasacort as needed - can not use it daily-it causes him to break out Will retry Singulair 10 mg nightly-he did have some weird thoughts with this in the past, but she is not sure if that was related to something else and would like to retry it

## 2020-07-17 NOTE — Assessment & Plan Note (Signed)
Chronic Diagnosed with asthma as a child Encourage smoking cessation Continue Symbicort 160-4.5 mcg per ACT twice daily and albuterol as needed Retry Singulair-he did not tolerate this well in the past, but he wonders if his side effects were related to something else and would like to retry it-10 mg nightly Continue Zyrtec, Xyzal

## 2020-07-20 LAB — PSA, TOTAL AND FREE
PSA, % Free: 50 % (calc) (ref >25)
PSA, Free: 0.1 ng/mL
PSA, Total: 0.2 ng/mL (ref <=4.0)

## 2020-09-26 ENCOUNTER — Other Ambulatory Visit: Payer: Self-pay | Admitting: Internal Medicine

## 2020-09-26 DIAGNOSIS — R030 Elevated blood-pressure reading, without diagnosis of hypertension: Secondary | ICD-10-CM

## 2020-12-19 ENCOUNTER — Ambulatory Visit (HOSPITAL_COMMUNITY)
Admission: EM | Admit: 2020-12-19 | Discharge: 2020-12-19 | Disposition: A | Discharge status: Home / Self Care | Payer: 59 | Attending: Family Medicine | Admitting: Family Medicine

## 2020-12-19 ENCOUNTER — Encounter (HOSPITAL_COMMUNITY): Payer: Self-pay

## 2020-12-19 ENCOUNTER — Other Ambulatory Visit: Payer: Self-pay

## 2020-12-19 DIAGNOSIS — R509 Fever, unspecified: Secondary | ICD-10-CM

## 2020-12-19 DIAGNOSIS — J029 Acute pharyngitis, unspecified: Secondary | ICD-10-CM

## 2020-12-19 LAB — POCT RAPID STREP A, ED / UC: Streptococcus, Group A Screen (Direct): NEGATIVE

## 2020-12-19 LAB — POC INFLUENZA A AND B ANTIGEN (URGENT CARE ONLY)
INFLUENZA A ANTIGEN, POC: NEGATIVE
INFLUENZA B ANTIGEN, POC: NEGATIVE

## 2020-12-19 MED ORDER — LIDOCAINE VISCOUS HCL 2 % MT SOLN: 10.0000 mL | OROMUCOSAL | 0 refills | Status: DC | PRN

## 2020-12-19 MED ORDER — AMOXICILLIN 875 MG PO TABS: 875.0000 mg | ORAL_TABLET | Freq: Two times a day (BID) | ORAL | 0 refills | Status: DC

## 2020-12-19 NOTE — ED Notes (Signed)
Pt declined Tylenol.

## 2020-12-19 NOTE — ED Triage Notes (Signed)
Pt presents with c/o a sore throat and states he noticed he has swollen glands and states he woke up this morning with a fever of 101.5. States he took Advil last night.

## 2020-12-19 NOTE — ED Provider Notes (Signed)
[facility]    CSN: [id number] Arrival date & time: 12/19/20  1007      History   Chief Complaint Chief Complaint  Patient presents with   Sore Throat    HPI Roberto Jenkins is a 60 y.o. male.   Patient presenting today with a sudden onset sore throat, swollen lymph nodes and high fever, chills, body aches.  Denies cough, congestion, chest pain, shortness of breath, abdominal pain, nausea vomiting or diarrhea.  So far took some Advil last night with mild temporary relief but otherwise not taking anything for symptoms.  No known sick contacts recently.  History of seasonal allergies and asthma on chronic regimen for both.   Past Medical History:  Diagnosis Date   Asthma    Hx of adenomatous polyp of colon 12/02/2014    Patient Active Problem List   Diagnosis Date Noted   Allergic rhinitis 07/17/2020   Palpitations 08/22/2019   Mild persistent asthma without complication 03/55/9741   Chronic obstructive pulmonary disease ([place]) 06/22/2017   Fatty liver 03/28/2017   Hypertension 02/08/2017   Alcohol consumption heavy 02/08/2017   Erectile dysfunction 02/05/2015   Hx of adenomatous polyp of colon 12/02/2014   Hemorrhoid 09/26/2014   Low back pain 09/26/2014    Past Surgical History:  Procedure Laterality Date   COLONOSCOPY  2004   in [place];pt doesn't know MD name.   HERNIA REPAIR Bilateral    NOSE SURGERY     x2       Home Medications    Prior to Admission medications   Medication Sig Start Date End Date Taking? Authorizing Provider  amoxicillin (AMOXIL) 875 MG tablet Take 1 tablet (875 mg total) by mouth 2 (two) times daily. 12/19/20  Yes Volney American, PA-C  lidocaine (XYLOCAINE) 2 % solution Use as directed 10 mLs in the mouth or throat as needed for mouth pain. 12/19/20  Yes Volney American, PA-C  albuterol (VENTOLIN HFA) 108 (90 Base) MCG/ACT inhaler Inhale 2 puffs into the lungs every 6 (six) hours as needed for wheezing  or shortness of breath (or chest tightness).  04/16/18   [provider]  amLODipine (NORVASC) 5 MG tablet TAKE 1 TABLET(5 MG) BY MOUTH DAILY 09/28/20   Binnie Rail, MD  budesonide-formoterol [facility]) 160-4.5 MCG/ACT inhaler Inhale 2 puffs into the lungs 2 (two) times daily. 07/17/20   Binnie Rail, MD  cetirizine (ZYRTEC) 10 MG tablet Take 10 mg by mouth at bedtime.     [provider]  levocetirizine (XYZAL) 5 MG tablet Take 1 tablet (5 mg total) by mouth every evening. 02/01/18   Lance Sell, NP  montelukast (SINGULAIR) 10 MG tablet Take 1 tablet (10 mg total) by mouth at bedtime. 07/17/20   Binnie Rail, MD  triamcinolone (NASACORT) 55 MCG/ACT AERO nasal inhaler Place 2 sprays into the nose daily. 05/31/17   Marrian Salvage, FNP    Family History Family History  Adopted: Yes  Family history unknown: Yes    Social History Social History   Tobacco Use   Smoking status: Some Days    Packs/day: 1.00    Years: 38.00    Pack years: 38.00    Types: Cigarettes   Smokeless tobacco: Never  Substance Use Topics   Alcohol use: Yes    Alcohol/week: 5.0 standard drinks    Types: 5 Shots of liquor per week   Drug use: Yes    Types: Amphetamines, LSD, Other-see comments  Comment: once every 3 months; occasionally, uses mushrooms.      Allergies   Montelukast   Review of Systems Review of Systems Per HPI  Physical Exam Triage Vital Signs ED Triage Vitals  Enc Vitals Group     BP 12/19/20 1032 (!) 161/99     Pulse Rate 12/19/20 1032 97     Resp 12/19/20 1032 19     Temp 12/19/20 1032 (!) 100.5 F (38.1 C)     Temp Source 12/19/20 1032 Oral     SpO2 12/19/20 1032 97 %     Weight --      Height --      Head Circumference --      Peak Flow --      Pain Score 12/19/20 1031 6     Pain Loc --      Pain Edu? --      Excl. in [place]? --    No data found.  Updated Vital Signs BP (!) 161/99 (BP Location: Right Arm)   Pulse 97   Temp (!)  100.5 F (38.1 C) (Oral)   Resp 19   SpO2 97%   Visual Acuity Right Eye Distance:   Left Eye Distance:   Bilateral Distance:    Right Eye Near:   Left Eye Near:    Bilateral Near:     Physical Exam Vitals and nursing note reviewed.  Constitutional:      Appearance: Normal appearance.  HENT:     Head: Atraumatic.     Right Ear: Tympanic membrane normal.     Left Ear: Tympanic membrane normal.     Nose: Nose normal.     Mouth/Throat:     Mouth: Mucous membranes are moist.     Pharynx: Oropharyngeal exudate and posterior oropharyngeal erythema present.     Comments: Moderate tonsillar erythema, edema, exudates.  Uvula midline, oral airway patent Eyes:     Extraocular Movements: Extraocular movements intact.     Conjunctiva/sclera: Conjunctivae normal.  Cardiovascular:     Rate and Rhythm: Normal rate and regular rhythm.  Pulmonary:     Effort: Pulmonary effort is normal.     Breath sounds: Normal breath sounds. No wheezing or rales.  Abdominal:     General: Bowel sounds are normal. There is no distension.     Palpations: Abdomen is soft.     Tenderness: There is no abdominal tenderness. There is no guarding.  Musculoskeletal:        General: Normal range of motion.     Cervical back: Normal range of motion and neck supple.  Skin:    General: Skin is warm and dry.  Neurological:     General: No focal deficit present.     Mental Status: He is oriented to person, place, and time.  Psychiatric:        Mood and Affect: Mood normal.        Thought Content: Thought content normal.        Judgment: Judgment normal.     UC Treatments / Results  Labs (all labs ordered are listed, but only abnormal results are displayed) Labs Reviewed  CULTURE, GROUP A STREP [facility])  POCT RAPID STREP A, ED / UC  POC INFLUENZA A AND B ANTIGEN (URGENT CARE ONLY)    EKG   Radiology No results found.  Procedures Procedures (including critical care time)  Medications Ordered in  UC Medications - No data to display  Initial Impression / Assessment and Plan /  UC Course  I have reviewed the triage vital signs and the nursing notes.  Pertinent labs & imaging results that were available during my care of the patient were reviewed by me and considered in my medical decision making (see chart for details).     Febrile and mildly hypertensive in triage, otherwise vital signs reassuring and within normal limits.  He appears in no acute distress.  Rapid strep, rapid flu both negative and throat culture pending.  He declines COVID testing today.  Given exam findings and symptoms, will cover for bacterial tonsillitis with amoxicillin, viscous lidocaine for comfort.  Discussed supportive home care and return precautions.  Over-the-counter fever reducers and pain relievers as needed.  Final Clinical Impressions(s) / UC Diagnoses   Final diagnoses:  Pharyngitis, unspecified etiology  Fever, unspecified   Discharge Instructions   None    ED Prescriptions     Medication Sig Dispense Auth. Provider   amoxicillin (AMOXIL) 875 MG tablet Take 1 tablet (875 mg total) by mouth 2 (two) times daily. 20 tablet Volney American, [place]   lidocaine (XYLOCAINE) 2 % solution Use as directed 10 mLs in the mouth or throat as needed for mouth pain. 100 mL Volney American, [place]      PDMP not reviewed this encounter.   Volney American, [place] 12/19/20 1717

## 2020-12-21 LAB — CULTURE, GROUP A STREP (THRC)

## 2020-12-24 ENCOUNTER — Other Ambulatory Visit: Payer: Self-pay | Admitting: Internal Medicine

## 2020-12-24 DIAGNOSIS — R030 Elevated blood-pressure reading, without diagnosis of hypertension: Secondary | ICD-10-CM

## 2021-03-24 ENCOUNTER — Telehealth: Payer: Self-pay | Admitting: Physician Assistant

## 2021-03-24 ENCOUNTER — Other Ambulatory Visit: Payer: Self-pay | Admitting: Internal Medicine

## 2021-03-24 DIAGNOSIS — J208 Acute bronchitis due to other specified organisms: Secondary | ICD-10-CM

## 2021-03-24 DIAGNOSIS — B9689 Other specified bacterial agents as the cause of diseases classified elsewhere: Secondary | ICD-10-CM

## 2021-03-24 DIAGNOSIS — R030 Elevated blood-pressure reading, without diagnosis of hypertension: Secondary | ICD-10-CM

## 2021-03-24 MED ORDER — BENZONATATE 100 MG PO CAPS: 100.0000 mg | ORAL_CAPSULE | Freq: Three times a day (TID) | ORAL | 0 refills | Status: DC | PRN

## 2021-03-24 MED ORDER — DOXYCYCLINE HYCLATE 100 MG PO TABS: 100.0000 mg | ORAL_TABLET | Freq: Two times a day (BID) | ORAL | 0 refills | Status: DC

## 2021-03-24 NOTE — Progress Notes (Signed)

## 2021-03-24 NOTE — Progress Notes (Signed)
I have spent 5 minutes in review of e-visit questionnaire, review and updating patient chart, medical decision making and response to patient.   Kimari Lienhard Cody Joslynne Klatt, PA-C    

## 2021-05-27 ENCOUNTER — Ambulatory Visit (INDEPENDENT_AMBULATORY_CARE_PROVIDER_SITE_OTHER): Payer: 59 | Admitting: Family Medicine

## 2021-05-27 ENCOUNTER — Encounter: Payer: Self-pay | Admitting: Family Medicine

## 2021-05-27 ENCOUNTER — Ambulatory Visit (INDEPENDENT_AMBULATORY_CARE_PROVIDER_SITE_OTHER): Payer: 59

## 2021-05-27 VITALS — BP 128/86 | HR 80 | Temp 98.4°F | Ht 71.0 in | Wt 161.6 lb

## 2021-05-27 DIAGNOSIS — J4530 Mild persistent asthma, uncomplicated: Secondary | ICD-10-CM

## 2021-05-27 DIAGNOSIS — F17200 Nicotine dependence, unspecified, uncomplicated: Secondary | ICD-10-CM | POA: Diagnosis not present

## 2021-05-27 DIAGNOSIS — J449 Chronic obstructive pulmonary disease, unspecified: Secondary | ICD-10-CM

## 2021-05-27 DIAGNOSIS — R058 Other specified cough: Secondary | ICD-10-CM

## 2021-05-27 MED ORDER — PREDNISONE 10 MG (21) PO TBPK: ORAL_TABLET | Freq: Every day | ORAL | 0 refills | Status: DC

## 2021-05-27 MED ORDER — AMOXICILLIN-POT CLAVULANATE 875-125 MG PO TABS
1.0000 | ORAL_TABLET | Freq: Two times a day (BID) | ORAL | 0 refills | Status: DC
Start: 2021-05-27 — End: 2021-10-05

## 2021-05-27 NOTE — Patient Instructions (Addendum)
Go downstairs for the chest X ray ? ?Start steroids and antibiotics as prescribed.  ?The steroids are a tapering dose. Take 6 this evening, 5 tomorrow, 4 the next day and so on until finished.  ? ?Follow up if you are getting worse or not back to baseline after you finish the antibiotic.  ?

## 2021-05-27 NOTE — Progress Notes (Signed)
?  Subjective: ? Roberto Jenkins is a 61 y.o. male who presents for respiratory illness.   ?Symptoms include dyspnea, ear congestion, fever, productive cough, and purulent nasal drainage . ?Denies  dizziness, chest pain, abdominal pain, vomiting, diarrhea, LE edema . ?Using his usual allergy medication and inhalers for asthma/COPD for symptoms. ?Doxycycline in January after having Covid.  ? ?States he saw cardiologist after having Covid and had a work up.  ?Patient is a smoker. ?No other aggravating or relieving factors.  No other c/o. ? ? ?Past Medical History:  ?Diagnosis Date  ? Asthma   ? Hx of adenomatous polyp of colon 12/02/2014  ? ? ?ROS as in subjective ? ? ?Objective: ?BP 128/86 (BP Location: Right Arm, Patient Position: Sitting, Cuff Size: Large)   Pulse 80   Temp 98.4 ?F (36.9 ?C) (Oral)   Ht '5\' 11"'$  (1.803 m)   Wt 161 lb 9.6 oz (73.3 kg)   SpO2 98%   BMI 22.54 kg/m?  ? ?General appearance: Alert, WD/WN, no distress, mildly ill appearing ?                            Skin: warm, no rash ?                          Head: no sinus tenderness ?                           Eyes: conjunctiva normal, corneas clear, PERRLA ?                           Ears: pearly TMs, external ear canals normal ?                         Nose: septum midline, turbinates swollen, with erythema and clear discharge ?            Mouth/throat: MMM, tongue normal, mild pharyngeal erythema ?                          Neck: supple, no adenopathy, no thyromegaly, non tender ?                         Heart: RRR ?                        Lungs:  expiratory wheezes and scattered rhonchi ? ?    ?Assessment  ?Encounter Diagnoses  ?Name Primary?  ? Productive cough Yes  ? Mild persistent asthma without complication   ? Chronic obstructive pulmonary disease, unspecified COPD type ([place])   ? Smoker   ? ? ?  ?Plan: ?Medications prescribed today: ?Augmentin and Steroid dose pak.  ?He will go downstairs for a chest XR.  ?Continue current medications  including inhalers.  ? ?Currently he is not in any acute distress.  ?Patient was advised to call or return if worse or not improving in the next few days.   ? ?Patient voiced understanding of diagnosis, recommendations, and treatment plan. ? ?After visit summary given.  ? ?

## 2021-06-21 NOTE — Progress Notes (Signed)
?  ?Cardiology Office Note ? ? ?Date:  06/22/2021  ? ?ID:  [name], DOB 1960-05-31, MRN [id number] ? ?PCP:  Binnie Rail, MD  ?Cardiologist:   Minus Breeding, MD ? ? ?Chief Complaint  ?Patient presents with  ? Palpitations  ? ? ?  ?History of Present Illness: ?Roberto Jenkins is a 61 y.o. male who presents for follow up of chest pain and palpitations.   Coronary calcium score was zero and he had no CAD on CT.    He had a negative POET (Plain Old Exercise Treadmill) in 2019.   ? ?Since I last saw him he has developed some tingling into his left hand.  He notices this and actually can change positions and it will go away.  He has not been having any substernal chest discomfort, neck or arm discomfort.  He is not having any new shortness of breath, PND or orthopnea.  He has had continued palpitations that he says are probably increasing in frequency and may be duration and intensity.  He feels sometimes like it is elevated and he is "anxious."  He feels a fluttering.  He is not able to bring these on.  They happen at rest. ? ?He had some bronchitis.  He does not think his breathing has been quite well since he had COVID.  He has been managed for this with antibiotics.  He still smoking a few cigarettes. ? ? ?Past Medical History:  ?Diagnosis Date  ? Asthma   ? Hx of adenomatous polyp of colon 12/02/2014  ? ? ?Past Surgical History:  ?Procedure Laterality Date  ? COLONOSCOPY  2004  ? in [place];pt doesn't know MD name.  ? HERNIA REPAIR Bilateral   ? NOSE SURGERY    ? x2  ? ? ? ?Current Outpatient Medications  ?Medication Sig Dispense Refill  ? albuterol (VENTOLIN HFA) 108 (90 Base) MCG/ACT inhaler Inhale 2 puffs into the lungs every 6 (six) hours as needed for wheezing or shortness of breath (or chest tightness).     ? amoxicillin-clavulanate (AUGMENTIN) 875-125 MG tablet Take 1 tablet by mouth 2 (two) times daily. 20 tablet 0  ? budesonide-formoterol (SYMBICORT) 160-4.5 MCG/ACT inhaler Inhale 2 puffs into the  lungs 2 (two) times daily. 1 each 11  ? cetirizine (ZYRTEC) 10 MG tablet Take 10 mg by mouth at bedtime.    ? diltiazem (CARDIZEM CD) 180 MG 24 hr capsule Take 1 capsule (180 mg total) by mouth daily. 90 capsule 3  ? levocetirizine (XYZAL) 5 MG tablet Take 1 tablet (5 mg total) by mouth every evening. 30 tablet 3  ? lidocaine (XYLOCAINE) 2 % solution Use as directed 10 mLs in the mouth or throat as needed for mouth pain. 100 mL 0  ? montelukast (SINGULAIR) 10 MG tablet Take 1 tablet (10 mg total) by mouth at bedtime. 30 tablet 1  ? predniSONE (STERAPRED UNI-PAK 21 TAB) 10 MG (21) TBPK tablet Take by mouth daily. 21 tablet 0  ? triamcinolone (NASACORT) 55 MCG/ACT AERO nasal inhaler Place 2 sprays into the nose daily. 1 Inhaler 6  ? ?No current facility-administered medications for this visit.  ? ? ?Allergies:   Montelukast  ? ? ?ROS:  Please see the history of present illness.   Otherwise, review of systems are positive for none.   All other systems are reviewed and negative.  ? ? ?PHYSICAL EXAM: ?VS:  BP 121/76   Pulse 78   Ht '5\' 11"'$  (1.803 m)  Wt 160 lb 12.8 oz (72.9 kg)   SpO2 98%   BMI 22.43 kg/m?  , BMI Body mass index is 22.43 kg/m?. ?GENERAL:  Well appearing ?NECK:  No jugular venous distention, waveform within normal limits, carotid upstroke brisk and symmetric, no bruits, no thyromegaly ?LUNGS: Decreased breath sounds on the left with some rhonchi and few scattered wheezes ?CHEST:  Unremarkable ?HEART:  PMI not displaced or sustained,S1 and S2 within normal limits, no S3, no S4, no clicks, no rubs, no murmurs ?ABD:  Flat, positive bowel sounds normal in frequency in pitch, no bruits, no rebound, no guarding, no midline pulsatile mass, no hepatomegaly, no splenomegaly ?EXT:  2 plus pulses throughout, no edema, no cyanosis no clubbing ? ? ?EKG:  EKG  ordered today. ?Sinus rhythm, rate 78, left axis deviation, left anterior fascicular block, no acute ST-T wave changes. ? ?Recent Labs: ?07/17/2020: ALT 16;  BUN 11; Creatinine, Ser 0.73; Hemoglobin 15.6; Platelets 212.0; Potassium 4.4; Sodium 140  ? ? ?Lipid Panel ?   ?Component Value Date/Time  ? CHOL 214 (H) 07/17/2020 1531  ? TRIG 55.0 07/17/2020 1531  ? HDL 88.20 07/17/2020 1531  ? CHOLHDL 2 07/17/2020 1531  ? VLDL 11.0 07/17/2020 1531  ? [place] 115 (H) 07/17/2020 1531  ? ?  ? ?Wt Readings from Last 3 Encounters:  ?06/22/21 160 lb 12.8 oz (72.9 kg)  ?05/27/21 161 lb 9.6 oz (73.3 kg)  ?07/17/20 178 lb (80.7 kg)  ?  ? ? ?Other studies Reviewed: ?Additional studies/ records that were reviewed today include:  None. ?Review of the above records demonstrates:  NA ? ? ?ASSESSMENT AND PLAN: ? ?ARM PAIN:    He has really not pain but tingling in his hand.  This is positional.  He had normal coronaries on CT.  I would not suspect this to be cardiac.  He is going to look into possibly cervical disc issue.  ? ?PALPITATIONS:   With this complaint I would like to do a 3-day Zio patch.  My plan is after he wears this he will stop his amlodipine and start Cardizem 180 mg daily for both blood pressure and palpitations controlled.  ? ?TOBACCO: I discussed the need to quit smoking completely. ? ?HTN: This will be managed in the context of treating his palpitations.   ? ?Current medicines are reviewed at length with the patient today.  The patient does not have concerns regarding medicines. ? ?The following changes have been made:   None ? ?Labs/ tests ordered today include:    ? ?Orders Placed This Encounter  ?Procedures  ? LONG TERM MONITOR (3-14 DAYS)  ? EKG 12-Lead  ? ? ? ?Disposition:   FU with me in 3 months.  ? ? ?Signed, ?Minus Breeding, MD  ?06/22/2021 4:53 PM    ?[place] ? ? ? ?

## 2021-06-22 ENCOUNTER — Ambulatory Visit (INDEPENDENT_AMBULATORY_CARE_PROVIDER_SITE_OTHER): Payer: 59

## 2021-06-22 ENCOUNTER — Ambulatory Visit (INDEPENDENT_AMBULATORY_CARE_PROVIDER_SITE_OTHER): Payer: 59 | Admitting: Cardiology

## 2021-06-22 ENCOUNTER — Encounter: Payer: Self-pay | Admitting: Cardiology

## 2021-06-22 VITALS — BP 121/76 | HR 78 | Ht 71.0 in | Wt 160.8 lb

## 2021-06-22 DIAGNOSIS — R002 Palpitations: Secondary | ICD-10-CM

## 2021-06-22 DIAGNOSIS — R072 Precordial pain: Secondary | ICD-10-CM

## 2021-06-22 MED ORDER — DILTIAZEM HCL ER COATED BEADS 180 MG PO CP24: 180.0000 mg | ORAL_CAPSULE | Freq: Every day | ORAL | 3 refills | Status: DC

## 2021-06-22 NOTE — Patient Instructions (Addendum)
Medication Instructions:  ?STOP: Amlodipine 5 mg daily ?START: Cardizem 180 mg daily ? ?*If you need a refill on your cardiac medications before your next appointment, please call your pharmacy* ? ? ?Lab Work: ?None ordered today ? ? ?Testing/Procedures: ?Your physician has recommended that you wear a 3 day Zio monitor.  ? ?This monitor is a medical device that records the heart?s electrical activity. Doctors most often use these monitors to diagnose arrhythmias. Arrhythmias are problems with the speed or rhythm of the heartbeat. The monitor is a small device applied to your chest. You can wear one while you do your normal daily activities. While wearing this monitor if you have any symptoms to push the button and record what you felt. Once you have worn this monitor for the period of time provider prescribed (Usually 14 days), you will return the monitor device in the postage paid box. Once it is returned they will download the data collected and provide [place] with a report which the provider will then review and we will call you with those results. Important tips: ? ?Avoid showering during the first 24 hours of wearing the monitor. ?Avoid excessive sweating to help maximize wear time. ?Do not submerge the device, no hot tubs, and no swimming pools. ?Keep any lotions or oils away from the patch. ?After 24 hours you may shower with the patch on. Take brief showers with your back facing the shower head.  ?Do not remove patch once it has been placed because that will interrupt data and decrease adhesive wear time. ?Push the button when you have any symptoms and write down what you were feeling. ?Once you have completed wearing your monitor, remove and place into box which has postage paid and place in your outgoing mailbox.  ?If for some reason you have misplaced your box then call our office and we can provide another box and/or mail it off for you. ? ?  ? ? ?Follow-Up: ?At [facility], you and your health needs are  our priority.  As part of our continuing mission to provide you with exceptional heart care, we have created designated Provider Care Teams.  These Care Teams include your primary Cardiologist (physician) and Advanced Practice Providers (APPs -  Physician Assistants and Nurse Practitioners) who all work together to provide you with the care you need, when you need it. ? ?We recommend signing up for the patient portal called "MyChart".  Sign up information is provided on this After Visit Summary.  MyChart is used to connect with patients for Virtual Visits (Telemedicine).  Patients are able to view lab/test results, encounter notes, upcoming appointments, etc.  Non-urgent messages can be sent to your provider as well.   ?To learn more about what you can do with MyChart, go to [email or web address].   ? ?Your next appointment:   ?3 month(s) ? ?The format for your next appointment:   ?In Person ? ?Provider:   ?Minus Breeding, MD { ? ?Important Information About [place] ? ? ? ? ? ? ?

## 2021-06-22 NOTE — Progress Notes (Unsigned)
Enrolled patient for a 3 day Zio XT monitor to be mailed to patients home  

## 2021-06-27 DIAGNOSIS — R002 Palpitations: Secondary | ICD-10-CM | POA: Diagnosis not present

## 2021-07-12 ENCOUNTER — Encounter: Payer: Self-pay | Admitting: *Deleted

## 2021-07-12 NOTE — Telephone Encounter (Signed)
-----   Message from Minus Breeding, MD sent at 07/11/2021  3:38 PM EDT ----- ?There were no significant arrhythmias. See my note for instructions on stopping the Norvasc and starting Cardizem.    Call Mr. Gantt with the results and send results to Binnie Rail, MD ? ?

## 2021-07-12 NOTE — Telephone Encounter (Signed)
This encounter was created in error - please disregard.

## 2021-08-07 IMAGING — CT CT HEART MORP W/ CTA COR W/ SCORE W/ CA W/CM &/OR W/O CM
4 of 7 series · 8 of 20 positions shown, 9 images · non-contrast
Comparison: None.
COMPARISON: None.

Addendum:
EXAM:
OVER-READ INTERPRETATION  CT CHEST

The following report is an over-read performed by radiologist Dr.
Asiel Running [[email or web address]] on 07/08/2019. This over-read
does not include interpretation of cardiac or coronary anatomy or
pathology. The coronary CTA interpretation by the cardiologist is
attached.
CLINICAL DATA: 59 yo male with chest pain
Cardiac/Coronary  CT
TECHNIQUE: The patient was scanned on a Phillips Force scanner.

[Series 6: best diast 73 % · axial · 0.39mm/px · z∈[+1161,+1216]mm · 2 of 408 slices shown]
[im 136/408  vessel]
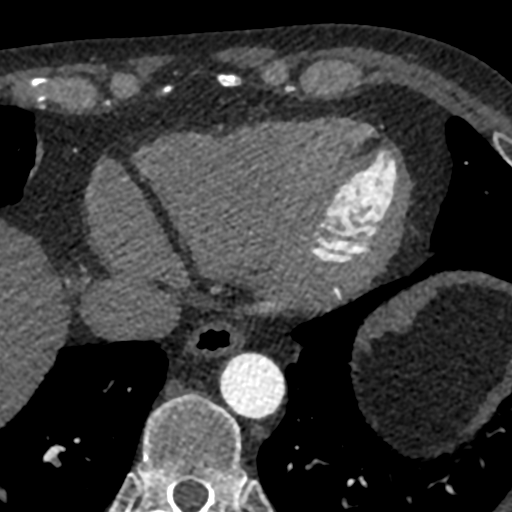
[im 272/408  vessel]
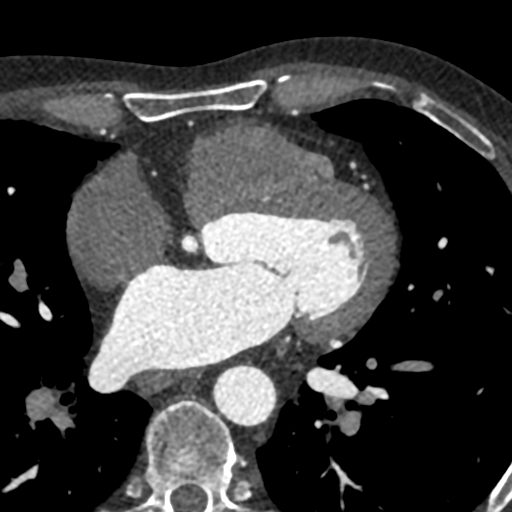

[Series 7: best syst · axial · 0.39mm/px · z∈[+1161,+1216]mm · 2 of 408 slices shown, 3 images]
[im 136/408  vessel]
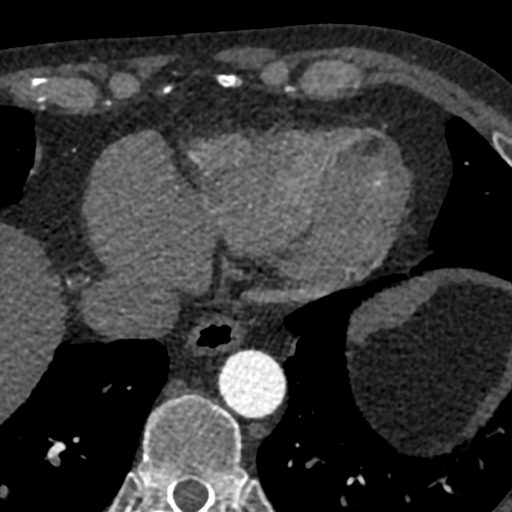
[im 136/408  lung]
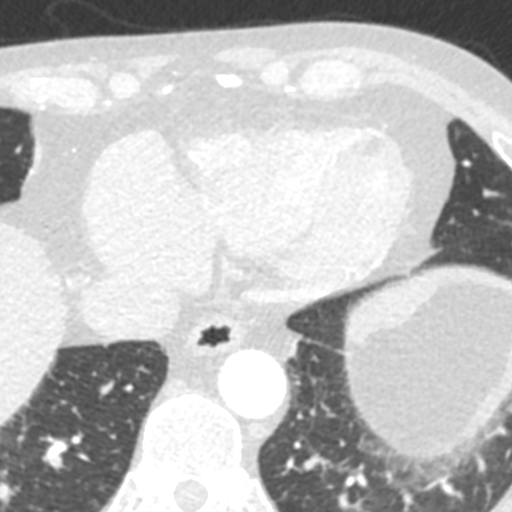
[im 272/408  vessel]
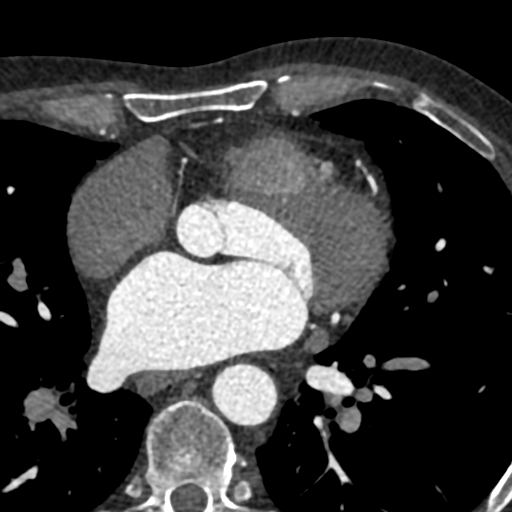

[Series 8: ts diast sharp · axial · 0.39mm/px · z∈[+1161,+1216]mm · 2 of 408 slices shown]
[im 136/408  lung]
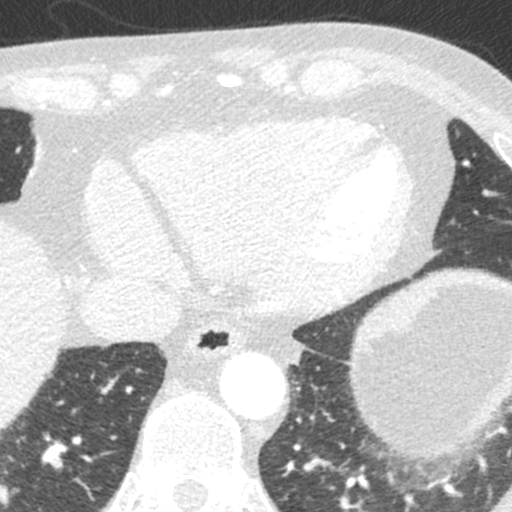
[im 272/408  lung]
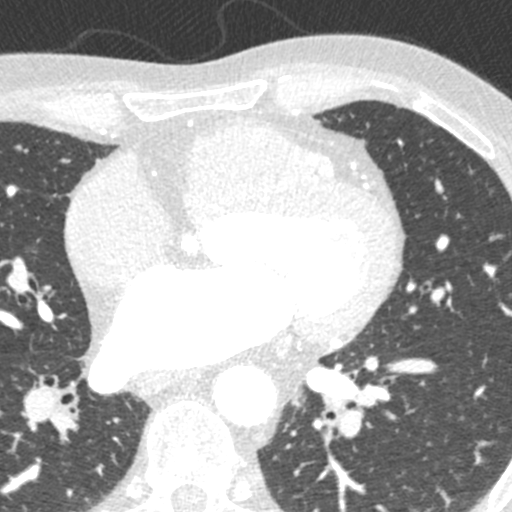

[Series 9: ts syst sharp · axial · 0.39mm/px · z∈[+1161,+1216]mm · 2 of 408 slices shown]
[im 136/408  lung]
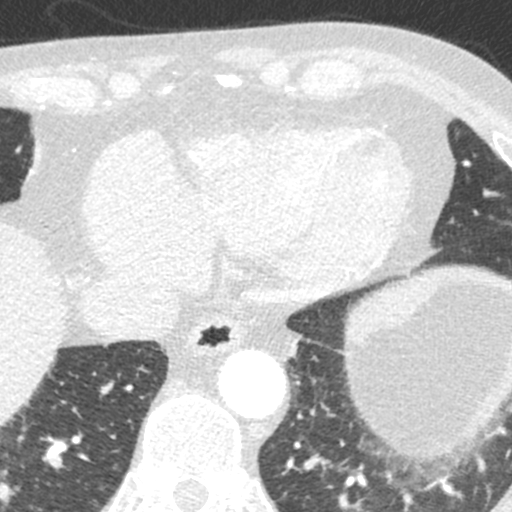
[im 272/408  lung]
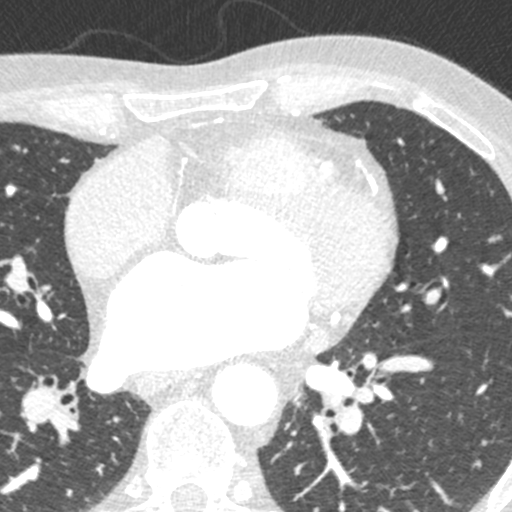

[8 of 20 positions shown; findings below may reference images not displayed]

FINDINGS: Vascular: Heart is normal size.  Visualized aorta is normal caliber.

Mediastinum/Nodes: No adenopathy

Lungs/Pleura: Visualized lungs clear.  No effusions.

Upper Abdomen: Imaging into the upper abdomen shows no acute
findings.

Musculoskeletal: Chest wall soft tissues are unremarkable. No acute
bony abnormality.
IMPRESSION: No acute or significant extracardiac abnormality.
FINDINGS: A 120 kV prospective scan was triggered in the descending thoracic
aorta at 111 HU's. Axial non-contrast 3 mm slices were carried out
through the heart. The data set was analyzed on a dedicated work
station and scored using the Agatson method. Gantry rotation speed
was 250 msecs and collimation was .6 mm. No beta blockade and 0.8 mg
of sl NTG was given. The 3D data set was reconstructed in 5%
intervals of the 67-82 % of the R-R cycle. Diastolic phases were
analyzed on a dedicated work station using MPR, MIP and VRT modes.
The patient received 80 cc of contrast.

Aorta: Normal size. Mild atherosclerosis aortic arch. No dissection.

Aortic Valve:  Trileaflet.  No calcifications.

Coronary Arteries:  Normal coronary origin.  Left dominance.

RCA is a small nondominant artery that gives rise to RV branch.
There is no plaque.

Left main is a large artery that gives rise to LAD and LCX arteries.

LAD gives rise to large branching diagonal; there is no plaque.

LCX is a dominant artery that gives rise to one large branching OM1
branch. There is no plaque.

Other findings:

Normal pulmonary vein drainage into the left atrium.

Normal let atrial appendage without a thrombus.

Normal size of the pulmonary artery.
IMPRESSION: 1. Coronary calcium score of 0. This was 0 percentile for age and
sex matched control.

2. Normal coronary origin with right dominance.

3. No evidence of CAD; CAD-RADS 0.

Ine Emilie Frestad

*** End of Addendum ***
EXAM:
OVER-READ INTERPRETATION  CT CHEST

The following report is an over-read performed by radiologist Dr.
Asiel Running [REDACTED] on 07/08/2019. This over-read
does not include interpretation of cardiac or coronary anatomy or
pathology. The coronary CTA interpretation by the cardiologist is
attached.
FINDINGS: Vascular: Heart is normal size.  Visualized aorta is normal caliber.

Mediastinum/Nodes: No adenopathy

Lungs/Pleura: Visualized lungs clear.  No effusions.

Upper Abdomen: Imaging into the upper abdomen shows no acute
findings.

Musculoskeletal: Chest wall soft tissues are unremarkable. No acute
bony abnormality.
IMPRESSION: No acute or significant extracardiac abnormality.

## 2021-10-03 DIAGNOSIS — M79603 Pain in arm, unspecified: Secondary | ICD-10-CM | POA: Insufficient documentation

## 2021-10-03 NOTE — Progress Notes (Signed)
Getting    Cardiology Office Note   Date:  10/05/2021   ID:  Roberto Jenkins, DOB 1960-08-07, MRN [id number]  PCP:  Binnie Rail, MD  Cardiologist:   Minus Breeding, MD   Chief Complaint  Patient presents with   Shortness of Breath      History of Present Illness: Roberto Jenkins is a 61 y.o. male who presents for follow up of chest pain and palpitations.   Coronary calcium score was zero and he had no CAD on CT.    He had a negative POET (Plain Old Exercise Treadmill) in 2019.    Since I last saw him I switched him to diltiazem for his blood pressure control.  He has not had any further palpitations.  He wore a monitor that did not demonstrate any significant arrhythmia.  Tingling in his arms and now is starting to have a "crick" in his neck.  He has not had any new chest pressure or arm discomfort.  He has had no new palpitations, presyncope or syncope.  He has had no weight gain or edema.   Past Medical History:  Diagnosis Date   Asthma    Hx of adenomatous polyp of colon 12/02/2014    Past Surgical History:  Procedure Laterality Date   COLONOSCOPY  2004   in [place];pt doesn't know MD name.   HERNIA REPAIR Bilateral    NOSE SURGERY     x2     Current Outpatient Medications  Medication Sig Dispense Refill   albuterol (VENTOLIN HFA) 108 (90 Base) MCG/ACT inhaler Inhale 2 puffs into the lungs every 6 (six) hours as needed for wheezing or shortness of breath (or chest tightness).      budesonide-formoterol (SYMBICORT) 160-4.5 MCG/ACT inhaler Inhale 2 puffs into the lungs 2 (two) times daily. 1 each 11   cetirizine (ZYRTEC) 10 MG tablet Take 10 mg by mouth at bedtime.     diltiazem (CARDIZEM CD) 180 MG 24 hr capsule Take 1 capsule (180 mg total) by mouth daily. 90 capsule 3   levocetirizine (XYZAL) 5 MG tablet Take 1 tablet (5 mg total) by mouth every evening. 30 tablet 3   lidocaine (XYLOCAINE) 2 % solution Use as directed 10 mLs in the mouth or throat as needed for  mouth pain. 100 mL 0   montelukast (SINGULAIR) 10 MG tablet Take 1 tablet (10 mg total) by mouth at bedtime. 30 tablet 1   triamcinolone (NASACORT) 55 MCG/ACT AERO nasal inhaler Place 2 sprays into the nose daily. 1 Inhaler 6   No current facility-administered medications for this visit.    Allergies:   Montelukast    ROS:  Please see the history of present illness.   Otherwise, review of systems are positive for none.   All other systems are reviewed and negative.    PHYSICAL EXAM: VS:  BP 126/84   Pulse 86   Ht '5\' 11"'$  (1.803 m)   Wt 159 lb (72.1 kg)   SpO2 98%   BMI 22.18 kg/m  , BMI Body mass index is 22.18 kg/m. GENERAL:  Well appearing NECK:  No jugular venous distention, waveform within normal limits, carotid upstroke brisk and symmetric, no bruits, no thyromegaly LUNGS:  Clear to auscultation bilaterally CHEST:  Unremarkable HEART:  PMI not displaced or sustained,S1 and S2 within normal limits, no S3, no S4, no clicks, no rubs, very brief apical nonradiating systolic murmur, no diastolic murmurs ABD:  Flat, positive bowel sounds normal in  frequency in pitch, no bruits, no rebound, no guarding, no midline pulsatile mass, no hepatomegaly, no splenomegaly EXT:  2 plus pulses throughout, no edema, no cyanosis no clubbing  EKG:  EKG not ordered today.   Recent Labs: No results found for requested labs within last 365 days.    Lipid Panel    Component Value Date/Time   CHOL 214 (H) 07/17/2020 1531   TRIG 55.0 07/17/2020 1531   HDL 88.20 07/17/2020 1531   CHOLHDL 2 07/17/2020 1531   VLDL 11.0 07/17/2020 1531   LDLCALC 115 (H) 07/17/2020 1531      Wt Readings from Last 3 Encounters:  10/05/21 159 lb (72.1 kg)  06/22/21 160 lb 12.8 oz (72.9 kg)  05/27/21 161 lb 9.6 oz (73.3 kg)      Other studies Reviewed: Additional studies/ records that were reviewed today include:  None. Review of the above records demonstrates:  NA   ASSESSMENT AND PLAN:  ARM PAIN:     His arm tingling is not cardiac for the study and for the atypical nature of it.  I have suggested he have somebody consider whether this could be a cervical disc issue.    PALPITATIONS:   He had no significant arrhythmias on monitor.  He will continue the Cardizem.   TOBACCO: We again talked about the need to stop smoking.   HTN: His blood pressure is well controlled.  Continue the meds as listed.   Current medicines are reviewed at length with the patient today.  The patient does not have concerns regarding medicines.  The following changes have been made:   None  Labs/ tests ordered today include:     No orders of the defined types were placed in this encounter.    Disposition:   FU with me as needed  Signed, Minus Breeding, MD  10/05/2021 3:58 PM    [facility]

## 2021-10-05 ENCOUNTER — Ambulatory Visit (INDEPENDENT_AMBULATORY_CARE_PROVIDER_SITE_OTHER): Payer: Commercial Managed Care - HMO | Admitting: Cardiology

## 2021-10-05 ENCOUNTER — Encounter: Payer: Self-pay | Admitting: Cardiology

## 2021-10-05 VITALS — BP 126/84 | HR 86 | Ht 71.0 in | Wt 159.0 lb

## 2021-10-05 DIAGNOSIS — R002 Palpitations: Secondary | ICD-10-CM | POA: Diagnosis not present

## 2021-10-05 DIAGNOSIS — I10 Essential (primary) hypertension: Secondary | ICD-10-CM | POA: Diagnosis not present

## 2021-10-05 DIAGNOSIS — M79603 Pain in arm, unspecified: Secondary | ICD-10-CM

## 2021-10-05 NOTE — Patient Instructions (Signed)

## 2021-10-21 NOTE — Progress Notes (Signed)
Subjective:    Patient ID: Roberto Jenkins, male    DOB: 03-23-1960, 61 y.o.   MRN: [id number]     HPI Cohen is here for a physical exam.   Stomach issues - intermittent symptoms sometimes when he eats - bloating, gerd, increased gas, burping.  Discomfort in RLQ - intermittently.  Had appendicitis as a child, but did not have appendectomy.  Taking omeprazole daily, tums.  Eating fried foods, onions, cucumbers, peppers makes symptoms worse.     Lost weight - eating healthy and eating less.  He is also very active.    Medications and allergies reviewed with patient and updated if appropriate.  Current Outpatient Medications on File Prior to Visit  Medication Sig Dispense Refill   albuterol (VENTOLIN HFA) 108 (90 Base) MCG/ACT inhaler Inhale 2 puffs into the lungs every 6 (six) hours as needed for wheezing or shortness of breath (or chest tightness).      budesonide-formoterol (SYMBICORT) 160-4.5 MCG/ACT inhaler Inhale 2 puffs into the lungs 2 (two) times daily. 1 each 11   cetirizine (ZYRTEC) 10 MG tablet Take 10 mg by mouth at bedtime.     diltiazem (CARDIZEM CD) 180 MG 24 hr capsule Take 1 capsule (180 mg total) by mouth daily. 90 capsule 3   levocetirizine (XYZAL) 5 MG tablet Take 1 tablet (5 mg total) by mouth every evening. 30 tablet 3   lidocaine (XYLOCAINE) 2 % solution Use as directed 10 mLs in the mouth or throat as needed for mouth pain. 100 mL 0   montelukast (SINGULAIR) 10 MG tablet Take 1 tablet (10 mg total) by mouth at bedtime. 30 tablet 1   triamcinolone (NASACORT) 55 MCG/ACT AERO nasal inhaler Place 2 sprays into the nose daily. 1 Inhaler 6   No current facility-administered medications on file prior to visit.    Review of Systems  Constitutional:  Negative for appetite change, chills, fever and unexpected weight change.  Eyes:  Negative for visual disturbance.  Respiratory:  Positive for cough (occ) and wheezing (occ). Negative for shortness of breath.    Cardiovascular:  Positive for palpitations (occ). Negative for chest pain and leg swelling.  Gastrointestinal:  Positive for abdominal distention, abdominal pain, anal bleeding (occ), constipation, diarrhea and nausea (occ). Negative for blood in stool (no melena).       GERD  Genitourinary:  Negative for difficulty urinating, dysuria and hematuria.  Musculoskeletal:  Positive for arthralgias (fingers, big toe, knees), joint swelling (finger joints) and neck pain (with numbness in left arm - seeing chiropractor). Negative for back pain.  Skin:  Negative for rash.  Neurological:  Positive for headaches (from neck pain only). Negative for light-headedness.  Hematological:  Bruises/bleeds easily.  Psychiatric/Behavioral:  Negative for dysphoric mood. The patient is not nervous/anxious.        Objective:   Vitals:   10/22/21 1335  BP: 120/70  Pulse: 68  Temp: 98.1 F (36.7 C)  SpO2: 98%   Filed Weights   10/22/21 1335  Weight: 158 lb (71.7 kg)   Body mass index is 22.04 kg/m.  BP Readings from Last 3 Encounters:  10/22/21 120/70  10/05/21 126/84  06/22/21 121/76    Wt Readings from Last 3 Encounters:  10/22/21 158 lb (71.7 kg)  10/05/21 159 lb (72.1 kg)  06/22/21 160 lb 12.8 oz (72.9 kg)      Physical Exam Constitutional: He appears well-developed and well-nourished. No distress.  HENT:  Head: Normocephalic and atraumatic.  Right  Ear: External ear normal.  Left Ear: External ear normal.  Mouth/Throat: Oropharynx is clear and moist.  Normal ear canals and TM b/l  Eyes: Conjunctivae and EOM are normal.  Neck: Neck supple. No tracheal deviation present. No thyromegaly present.  No carotid bruit  Cardiovascular: Normal rate, regular rhythm, normal heart sounds and intact distal pulses.   No murmur heard. Pulmonary/Chest: Effort normal and breath sounds normal. No respiratory distress. He has no wheezes. He has no rales.  Abdominal: Soft. He exhibits no distension.  There is no tenderness.  Genitourinary: deferred  Musculoskeletal: He exhibits no edema.  Lymphadenopathy:   He has no cervical adenopathy.  Skin: Skin is warm and dry. He is not diaphoretic.  Psychiatric: He has a normal mood and affect. His behavior is normal.         Assessment & Plan:   Physical exam: Screening blood work  ordered Exercise   very active, playing a lot of golf Weight  normal - has lost weight - eating healthier and very active Substance abuse   none   Reviewed recommended immunizations.   Health Maintenance  Topic Date Due   Zoster Vaccines- Shingrix (1 of 2) Never done   COVID-19 Vaccine (4 - Moderna series) 04/07/2020   INFLUENZA VACCINE  09/28/2021   COLONOSCOPY (Pts 45-44yr Insurance coverage will need to be confirmed)  11/23/2021   TETANUS/TDAP  02/09/2027   Hepatitis C Screening  Completed   HIV Screening  Completed   HPV VACCINES  Aged Out     See Problem List for Assessment and Plan of chronic medical problems.

## 2021-10-21 NOTE — Patient Instructions (Addendum)
Blood work was ordered.     Medications changes include :   none    Return in about 1 year (around 10/23/2022) for Physical Exam.   Health Maintenance, Male Adopting a healthy lifestyle and getting preventive care are important in promoting health and wellness. Ask your health care provider about: The right schedule for you to have regular tests and exams. Things you can do on your own to prevent diseases and keep yourself healthy. What should I know about diet, weight, and exercise? Eat a healthy diet  Eat a diet that includes plenty of vegetables, fruits, low-fat dairy products, and lean protein. Do not eat a lot of foods that are high in solid fats, added sugars, or sodium. Maintain a healthy weight Body mass index (BMI) is a measurement that can be used to identify possible weight problems. It estimates body fat based on height and weight. Your health care provider can help determine your BMI and help you achieve or maintain a healthy weight. Get regular exercise Get regular exercise. This is one of the most important things you can do for your health. Most adults should: Exercise for at least 150 minutes each week. The exercise should increase your heart rate and make you sweat (moderate-intensity exercise). Do strengthening exercises at least twice a week. This is in addition to the moderate-intensity exercise. Spend less time sitting. Even light physical activity can be beneficial. Watch cholesterol and blood lipids Have your blood tested for lipids and cholesterol at 61 years of age, then have this test every 5 years. You may need to have your cholesterol levels checked more often if: Your lipid or cholesterol levels are high. You are older than 61 years of age. You are at high risk for heart disease. What should I know about cancer screening? Many types of cancers can be detected early and may often be prevented. Depending on your health history and family history,  you may need to have cancer screening at various ages. This may include screening for: Colorectal cancer. Prostate cancer. Skin cancer. Lung cancer. What should I know about heart disease, diabetes, and high blood pressure? Blood pressure and heart disease High blood pressure causes heart disease and increases the risk of stroke. This is more likely to develop in people who have high blood pressure readings or are overweight. Talk with your health care provider about your target blood pressure readings. Have your blood pressure checked: Every 3-5 years if you are 69-59 years of age. Every year if you are 55 years old or older. If you are between the ages of 20 and 6 and are a current or former smoker, ask your health care provider if you should have a one-time screening for abdominal aortic aneurysm (AAA). Diabetes Have regular diabetes screenings. This checks your fasting blood sugar level. Have the screening done: Once every three years after age 48 if you are at a normal weight and have a low risk for diabetes. More often and at a younger age if you are overweight or have a high risk for diabetes. What should I know about preventing infection? Hepatitis B If you have a higher risk for hepatitis B, you should be screened for this virus. Talk with your health care provider to find out if you are at risk for hepatitis B infection. Hepatitis C Blood testing is recommended for: Everyone born from [birth year] through 1965. Anyone with known risk factors for hepatitis C. Sexually transmitted infections (STIs) You should  be screened each year for STIs, including gonorrhea and chlamydia, if: You are sexually active and are younger than 61 years of age. You are older than 61 years of age and your health care provider tells you that you are at risk for this type of infection. Your sexual activity has changed since you were last screened, and you are at increased risk for chlamydia or gonorrhea. Ask  your health care provider if you are at risk. Ask your health care provider about whether you are at high risk for HIV. Your health care provider may recommend a prescription medicine to help prevent HIV infection. If you choose to take medicine to prevent HIV, you should first get tested for HIV. You should then be tested every 3 months for as long as you are taking the medicine. Follow these instructions at home: Alcohol use Do not drink alcohol if your health care provider tells you not to drink. If you drink alcohol: Limit how much you have to 0-2 drinks a day. Know how much alcohol is in your drink. In the [place] one drink equals one 12 oz bottle of beer (355 mL), one 5 oz glass of wine (148 mL), or one 1 oz glass of hard liquor (44 mL). Lifestyle Do not use any products that contain nicotine or tobacco. These products include cigarettes, chewing tobacco, and vaping devices, such as e-cigarettes. If you need help quitting, ask your health care provider. Do not use street drugs. Do not share needles. Ask your health care provider for help if you need support or information about quitting drugs. General instructions Schedule regular health, dental, and eye exams. Stay current with your vaccines. Tell your health care provider if: You often feel depressed. You have ever been abused or do not feel safe at home. Summary Adopting a healthy lifestyle and getting preventive care are important in promoting health and wellness. Follow your health care provider's instructions about healthy diet, exercising, and getting tested or screened for diseases. Follow your health care provider's instructions on monitoring your cholesterol and blood pressure. This information is not intended to replace advice given to you by your health care provider. Make sure you discuss any questions you have with your health care provider. Document Revised: 07/06/2020 Document Reviewed: 07/06/2020 Elsevier Patient  Education  [place].

## 2021-10-22 ENCOUNTER — Encounter: Payer: Self-pay | Admitting: Internal Medicine

## 2021-10-22 ENCOUNTER — Ambulatory Visit (INDEPENDENT_AMBULATORY_CARE_PROVIDER_SITE_OTHER): Payer: Commercial Managed Care - HMO | Admitting: Internal Medicine

## 2021-10-22 VITALS — BP 120/70 | HR 68 | Temp 98.1°F | Ht 71.0 in | Wt 158.0 lb

## 2021-10-22 DIAGNOSIS — R1031 Right lower quadrant pain: Secondary | ICD-10-CM

## 2021-10-22 DIAGNOSIS — Z0000 Encounter for general adult medical examination without abnormal findings: Secondary | ICD-10-CM | POA: Diagnosis not present

## 2021-10-22 DIAGNOSIS — M2550 Pain in unspecified joint: Secondary | ICD-10-CM | POA: Diagnosis not present

## 2021-10-22 DIAGNOSIS — J449 Chronic obstructive pulmonary disease, unspecified: Secondary | ICD-10-CM

## 2021-10-22 DIAGNOSIS — I10 Essential (primary) hypertension: Secondary | ICD-10-CM

## 2021-10-22 DIAGNOSIS — J3089 Other allergic rhinitis: Secondary | ICD-10-CM

## 2021-10-22 DIAGNOSIS — Z125 Encounter for screening for malignant neoplasm of prostate: Secondary | ICD-10-CM | POA: Diagnosis not present

## 2021-10-22 DIAGNOSIS — J4530 Mild persistent asthma, uncomplicated: Secondary | ICD-10-CM

## 2021-10-22 LAB — COMPREHENSIVE METABOLIC PANEL
ALT: 37 U/L (ref 0–53)
AST: 36 U/L (ref 0–37)
Albumin: 4.6 g/dL (ref 3.5–5.2)
Alkaline Phosphatase: 53 U/L (ref 39–117)
BUN: 8 mg/dL (ref 6–23)
CO2: 31 mEq/L (ref 19–32)
Calcium: 9.6 mg/dL (ref 8.4–10.5)
Chloride: 101 mEq/L (ref 96–112)
Creatinine, Ser: 0.78 mg/dL (ref 0.40–1.50)
GFR: 96.18 mL/min (ref >60.00)
Glucose, Bld: 82 mg/dL (ref 70–99)
Potassium: 4.3 mEq/L (ref 3.5–5.1)
Sodium: 144 mEq/L (ref 135–145)
Total Bilirubin: 0.9 mg/dL (ref 0.2–1.2)
Total Protein: 6.8 g/dL (ref 6.0–8.3)

## 2021-10-22 LAB — CBC WITH DIFFERENTIAL/PLATELET
Basophils Absolute: 0.1 10*3/uL (ref 0.0–0.1)
Basophils Relative: 1.7 % (ref 0.0–3.0)
Eosinophils Absolute: 0.2 10*3/uL (ref 0.0–0.7)
Eosinophils Relative: 3.8 % (ref 0.0–5.0)
HCT: 44.3 % (ref 39.0–52.0)
Hemoglobin: 14.7 g/dL (ref 13.0–17.0)
Lymphocytes Relative: 33 % (ref 12.0–46.0)
Lymphs Abs: 1.8 10*3/uL (ref 0.7–4.0)
MCHC: 33.2 g/dL (ref 30.0–36.0)
MCV: 94.7 fl (ref 78.0–100.0)
Monocytes Absolute: 0.6 10*3/uL (ref 0.1–1.0)
Monocytes Relative: 10.4 % (ref 3.0–12.0)
Neutro Abs: 2.8 10*3/uL (ref 1.4–7.7)
Neutrophils Relative %: 51.1 % (ref 43.0–77.0)
Platelets: 200 10*3/uL (ref 150.0–400.0)
RBC: 4.67 Mil/uL (ref 4.22–5.81)
RDW: 13.2 % (ref 11.5–15.5)
WBC: 5.5 10*3/uL (ref 4.0–10.5)

## 2021-10-22 LAB — LIPID PANEL
Cholesterol: 217 mg/dL — ABNORMAL HIGH (ref 0–200)
HDL: 124.4 mg/dL (ref >39.00)
LDL Cholesterol: 81 mg/dL (ref 0–99)
NonHDL: 92.94
Total CHOL/HDL Ratio: 2
Triglycerides: 61 mg/dL (ref 0.0–149.0)
VLDL: 12.2 mg/dL (ref 0.0–40.0)

## 2021-10-22 LAB — PSA: PSA: 0.49 ng/mL (ref 0.10–4.00)

## 2021-10-22 LAB — C-REACTIVE PROTEIN: CRP: <1 mg/dL (ref 0.5–20.0)

## 2021-10-22 LAB — TSH: TSH: 2.56 u[IU]/mL (ref 0.35–5.50)

## 2021-10-22 LAB — SEDIMENTATION RATE: Sed Rate: 3 mm/hr (ref 0–20)

## 2021-10-22 NOTE — Assessment & Plan Note (Signed)
Chronic BP well controlled Continue diltiazem 180 mg daily cmp

## 2021-10-22 NOTE — Assessment & Plan Note (Signed)
Chronic Year round worse than usual On zyrtec, nasacort

## 2021-10-22 NOTE — Assessment & Plan Note (Signed)
Acute States right lower quadrant pain intermittent.  Associated with bloating, GERD, increased gas, burping Nontender on exam States he did have appendicitis as a child, but never had his appendix taken out.  Still has gallbladder Due for colonoscopy soon, which he will get once he gets the letter Can continue over-the-counter omeprazole symptoms helping CBC, CMP CT abdomen and pelvis with contrast

## 2021-10-22 NOTE — Assessment & Plan Note (Signed)
Chronic Likely has combination of asthma and COPD Since recent cold he has had a more persistent cough and does bring up some phlegm at times-discussed he could be developing chronic bronchitis Stressed Smoking cessation completely Continue Symbicort, albuterol

## 2021-10-22 NOTE — Assessment & Plan Note (Addendum)
Chronic Mild, intermittent symbicort bid prn Albuterol prn Still smoking sometimes-stressed complete cessation

## 2021-10-22 NOTE — Assessment & Plan Note (Signed)
Chronic Has finger, toe and sometimes knee pain Finger joint symptoms well He wonders about autoimmune arthritis or psoriatic arthritis He denies known psoriasis, but his son has psoriasis-he was adopted and unsure of family history Will check ANA, ESR, CRP, CCP, RF We will see hand surgeon about contracture in hand-advised to discuss with them as well

## 2021-10-25 LAB — ANA: Anti Nuclear Antibody (ANA): NEGATIVE

## 2021-10-25 LAB — RHEUMATOID FACTOR: Rheumatoid fact SerPl-aCnc: <14 IU/mL (ref <14)

## 2021-10-25 LAB — CYCLIC CITRUL PEPTIDE ANTIBODY, IGG: Cyclic Citrullin Peptide Ab: <16 UNITS

## 2021-11-07 ENCOUNTER — Emergency Department (HOSPITAL_BASED_OUTPATIENT_CLINIC_OR_DEPARTMENT_OTHER)
Admission: EM | Admit: 2021-11-07 | Discharge: 2021-11-07 | Disposition: A | Discharge status: Home / Self Care | Payer: Commercial Managed Care - HMO | Attending: Emergency Medicine | Admitting: Emergency Medicine

## 2021-11-07 ENCOUNTER — Emergency Department (HOSPITAL_BASED_OUTPATIENT_CLINIC_OR_DEPARTMENT_OTHER): Payer: Commercial Managed Care - HMO

## 2021-11-07 ENCOUNTER — Other Ambulatory Visit: Payer: Self-pay

## 2021-11-07 ENCOUNTER — Encounter (HOSPITAL_BASED_OUTPATIENT_CLINIC_OR_DEPARTMENT_OTHER): Payer: Self-pay | Admitting: Emergency Medicine

## 2021-11-07 DIAGNOSIS — R1031 Right lower quadrant pain: Secondary | ICD-10-CM | POA: Diagnosis not present

## 2021-11-07 DIAGNOSIS — R110 Nausea: Secondary | ICD-10-CM | POA: Insufficient documentation

## 2021-11-07 LAB — COMPREHENSIVE METABOLIC PANEL
ALT: 62 U/L — ABNORMAL HIGH (ref 0–44)
AST: 72 U/L — ABNORMAL HIGH (ref 15–41)
Albumin: 4.5 g/dL (ref 3.5–5.0)
Alkaline Phosphatase: 51 U/L (ref 38–126)
Anion gap: 11 (ref 5–15)
BUN: 6 mg/dL — ABNORMAL LOW (ref 8–23)
CO2: 25 mmol/L (ref 22–32)
Calcium: 9.6 mg/dL (ref 8.9–10.3)
Chloride: 100 mmol/L (ref 98–111)
Creatinine, Ser: 0.92 mg/dL (ref 0.61–1.24)
GFR, Estimated: >60 mL/min (ref >60)
Glucose, Bld: 121 mg/dL — ABNORMAL HIGH (ref 70–99)
Potassium: 3.7 mmol/L (ref 3.5–5.1)
Sodium: 136 mmol/L (ref 135–145)
Total Bilirubin: 0.4 mg/dL (ref 0.3–1.2)
Total Protein: 6.7 g/dL (ref 6.5–8.1)

## 2021-11-07 LAB — URINALYSIS, ROUTINE W REFLEX MICROSCOPIC
Bilirubin Urine: NEGATIVE
Glucose, UA: NEGATIVE mg/dL
Hgb urine dipstick: NEGATIVE
Leukocytes,Ua: NEGATIVE
Nitrite: NEGATIVE
Protein, ur: 30 mg/dL — AB
Specific Gravity, Urine: 1.025 (ref 1.005–1.030)
pH: 6 (ref 5.0–8.0)

## 2021-11-07 LAB — CBC
HCT: 41.9 % (ref 39.0–52.0)
Hemoglobin: 14.6 g/dL (ref 13.0–17.0)
MCH: 31.5 pg (ref 26.0–34.0)
MCHC: 34.8 g/dL (ref 30.0–36.0)
MCV: 90.5 fL (ref 80.0–100.0)
Platelets: 186 10*3/uL (ref 150–400)
RBC: 4.63 MIL/uL (ref 4.22–5.81)
RDW: 12.9 % (ref 11.5–15.5)
WBC: 8.7 10*3/uL (ref 4.0–10.5)
nRBC: 0 % (ref 0.0–0.2)

## 2021-11-07 LAB — LIPASE, BLOOD: Lipase: 50 U/L (ref 11–51)

## 2021-11-07 MED ORDER — IOHEXOL 300 MG/ML  SOLN
100.0000 mL | Freq: Once | INTRAMUSCULAR | Status: AC | PRN
  Administered 2021-11-07: 100 mL via INTRAVENOUS

## 2021-11-07 NOTE — ED Notes (Signed)
Patient verbalizes understanding of discharge instructions. Opportunity for questioning and answers were provided. Armband removed by staff, pt discharged from ED. Ambulated out to lobby  

## 2021-11-07 NOTE — ED Provider Notes (Signed)
[facility] EMERGENCY DEPT  Provider Note  CSN: [id number] Arrival date & time: 11/07/21 0046  History Chief Complaint  Patient presents with   Abdominal Pain    Roberto Jenkins is a 61 y.o. male reports several weeks of intermittent but daily RLQ abdominal pain. Seems to be worse after eating greasy food. Associated with nausea but no vomiting. He has had multiple loose but non blood stools each day. No fevers. Saw his PCP about 2 weeks ago for same and scheduled for a CT but the earliest it could be done was the end of the month. The pain was more severe than usual today prompting him to come here.    Home Medications Prior to Admission medications   Medication Sig Start Date End Date Taking? Authorizing Provider  albuterol (VENTOLIN HFA) 108 (90 Base) MCG/ACT inhaler Inhale 2 puffs into the lungs every 6 (six) hours as needed for wheezing or shortness of breath (or chest tightness).  04/16/18   [provider]  budesonide-formoterol (SYMBICORT) 160-4.5 MCG/ACT inhaler Inhale 2 puffs into the lungs 2 (two) times daily. 07/17/20   Binnie Rail, MD  cetirizine (ZYRTEC) 10 MG tablet Take 10 mg by mouth at bedtime.    [provider]  diltiazem (CARDIZEM CD) 180 MG 24 hr capsule Take 1 capsule (180 mg total) by mouth daily. 06/22/21   Minus Breeding, MD  lidocaine (XYLOCAINE) 2 % solution Use as directed 10 mLs in the mouth or throat as needed for mouth pain. 12/19/20   Volney American, PA-C  triamcinolone (NASACORT) 55 MCG/ACT AERO nasal inhaler Place 2 sprays into the nose daily. 05/31/17   Marrian Salvage, FNP     Allergies    Montelukast   Review of Systems   Review of Systems Please see HPI for pertinent positives and negatives  Physical Exam BP (!) 135/97 (BP Location: Right Arm)   Pulse 87   Temp 98.4 F (36.9 C) (Oral)   Resp 18   SpO2 98%   Physical Exam Vitals and nursing note reviewed.  Constitutional:      Appearance:  Normal appearance.  HENT:     Head: Normocephalic and atraumatic.     Nose: Nose normal.     Mouth/Throat:     Mouth: Mucous membranes are moist.  Eyes:     Extraocular Movements: Extraocular movements intact.     Conjunctiva/sclera: Conjunctivae normal.  Cardiovascular:     Rate and Rhythm: Normal rate.  Pulmonary:     Effort: Pulmonary effort is normal.     Breath sounds: Normal breath sounds.  Abdominal:     General: Abdomen is flat.     Palpations: Abdomen is soft.     Tenderness: There is abdominal tenderness in the right lower quadrant. There is no guarding. Negative signs include Murphy's sign and McBurney's sign.  Musculoskeletal:        General: No swelling. Normal range of motion.     Cervical back: Neck supple.  Skin:    General: Skin is warm and dry.  Neurological:     General: No focal deficit present.     Mental Status: He is alert.  Psychiatric:        Mood and Affect: Mood normal.     ED Results / Procedures / Treatments   EKG None  Procedures Procedures  Medications Ordered in the ED Medications  iohexol (OMNIPAQUE) 300 MG/ML solution 100 mL (100 mLs Intravenous Contrast Given 11/07/21 0605)  Initial Impression and Plan  Patient here with persistent R sided abdominal pain. He had appendicitis as a child managed non-operatively but given the duration of his symptoms appendicitis is unlikely. Will send for CT to complete his workup given delays in outpatient scheduling. He had labs done in triage showing a normal CBC, CMP with mildly elevated AST/ALT, he drinks EtOH daily. Lipase is normal. UA is unremarkable.   ED Course   Clinical Course as of 11/07/21 4536  Sun Nov 07, 2021  0658 I personally viewed the images from radiology studies and agree with radiologist interpretation: CT neg for acute process to explain patient's pain. Recommend PCP and/or GI follow up for further investigation.   [CS]    Clinical Course User Index [CS] Truddie Hidden, MD     MDM Rules/Calculators/A&P Medical Decision Making Problems Addressed: RLQ abdominal pain: chronic illness or injury with exacerbation, progression, or side effects of treatment  Amount and/or Complexity of Data Reviewed Labs: ordered. Decision-making details documented in ED Course. Radiology: ordered and independent interpretation performed. Decision-making details documented in ED Course.  Risk Prescription drug management.    Final Clinical Impression(s) / ED Diagnoses Final diagnoses:  RLQ abdominal pain    Rx / DC Orders ED Discharge Orders     None        Truddie Hidden, MD 11/07/21 [phone]

## 2021-11-07 NOTE — ED Triage Notes (Signed)
Pain in right lower quad for "weeks" Seen by PCP for same, CT was ordered but wasn't able to get an appointment so he came in.  Reports n/v/d

## 2021-11-21 ENCOUNTER — Other Ambulatory Visit: Payer: Self-pay | Admitting: Internal Medicine

## 2021-11-23 ENCOUNTER — Encounter: Payer: Self-pay | Admitting: Internal Medicine

## 2021-11-24 MED ORDER — FLUTICASONE-SALMETEROL 100-50 MCG/ACT IN AEPB: 1.0000 | INHALATION_SPRAY | Freq: Two times a day (BID) | RESPIRATORY_TRACT | 5 refills | Status: DC

## 2021-12-07 ENCOUNTER — Ambulatory Visit: Payer: Self-pay | Admitting: Physician Assistant

## 2021-12-24 ENCOUNTER — Encounter: Payer: Self-pay | Admitting: Internal Medicine

## 2022-03-29 ENCOUNTER — Telehealth: Payer: Self-pay | Admitting: Nurse Practitioner

## 2022-03-29 DIAGNOSIS — J440 Chronic obstructive pulmonary disease with acute lower respiratory infection: Secondary | ICD-10-CM

## 2022-03-29 DIAGNOSIS — J209 Acute bronchitis, unspecified: Secondary | ICD-10-CM

## 2022-03-29 MED ORDER — AZITHROMYCIN 250 MG PO TABS: ORAL_TABLET | ORAL | 0 refills | Status: AC

## 2022-03-29 MED ORDER — PREDNISONE 20 MG PO TABS: 20.0000 mg | ORAL_TABLET | Freq: Two times a day (BID) | ORAL | 0 refills | Status: AC

## 2022-03-29 MED ORDER — ALBUTEROL SULFATE HFA 108 (90 BASE) MCG/ACT IN AERS: 2.0000 | INHALATION_SPRAY | Freq: Four times a day (QID) | RESPIRATORY_TRACT | 0 refills | Status: DC | PRN

## 2022-03-29 MED ORDER — BENZONATATE 100 MG PO CAPS: 100.0000 mg | ORAL_CAPSULE | Freq: Three times a day (TID) | ORAL | 0 refills | Status: DC | PRN

## 2022-03-29 NOTE — Progress Notes (Signed)
Virtual Visit Consent   Roberto Jenkins, you are scheduled for a virtual visit with a [facility] provider today. Just as with appointments in the office, your consent must be obtained to participate. Your consent will be active for this visit and any virtual visit you may have with one of our providers in the next 365 days. If you have a MyChart account, a copy of this consent can be sent to you electronically.  As this is a virtual visit, video technology does not allow for your provider to perform a traditional examination. This may limit your provider's ability to fully assess your condition. If your provider identifies any concerns that need to be evaluated in person or the need to arrange testing (such as labs, EKG, etc.), we will make arrangements to do so. Although advances in technology are sophisticated, we cannot ensure that it will always work on either your end or our end. If the connection with a video visit is poor, the visit may have to be switched to a telephone visit. With either a video or telephone visit, we are not always able to ensure that we have a secure connection.  By engaging in this virtual visit, you consent to the provision of healthcare and authorize for your insurance to be billed (if applicable) for the services provided during this visit. Depending on your insurance coverage, you may receive a charge related to this service.  I need to obtain your verbal consent now. Are you willing to proceed with your visit today? Roberto Jenkins has provided verbal consent on 03/29/2022 for a virtual visit (video or telephone). Roberto Schneiders, FNP  Date: 03/29/2022 4:17 PM  Virtual Visit via Video Note   I, Roberto Jenkins, connected with  Roberto Jenkins  ([id number], 1961/01/12) on 03/29/22 at  4:30 PM EST by a video-enabled telemedicine application and verified that I am speaking with the correct person using two identifiers.  Location: Patient: Virtual Visit Location Patient:  Home Provider: Virtual Visit Location Provider: Home Office   I discussed the limitations of evaluation and management by telemedicine and the availability of in person appointments. The patient expressed understanding and agreed to proceed.    History of Present Illness: Roberto Jenkins is a 62 y.o. who identifies as a male who was assigned male at birth, and is being seen today for a cough.  He suffers from chronic bronchitis/COPD, most recent exacerbation was one year ago treated with Doxycyline at that time   He has been sick now for the past  few weeks  He has been using his Albuterol more recently  He is not taking anything over the counter   Recently switched insurance and is trying to get back on Symbicort   He has developed a low grade fever today  He is managed by PCP not currently under the care of pulmonology   Has needed oral steroids at times   Problems:  Patient Active Problem List   Diagnosis Date Noted   Arthralgia 10/22/2021   Right lower quadrant abdominal pain 10/22/2021   Pain of upper extremity 10/03/2021   Allergic rhinitis 07/17/2020   Palpitations 08/22/2019   Mild persistent asthma without complication 70/26/3785   Chronic obstructive pulmonary disease ([place]) 06/22/2017   Fatty liver 03/28/2017   Hypertension 02/08/2017   Alcohol consumption heavy 02/08/2017   Erectile dysfunction 02/05/2015   Hx of adenomatous polyp of colon 12/02/2014   Hemorrhoid 09/26/2014   Low back pain 09/26/2014  Allergies:  Allergies  Allergen Reactions   Montelukast Other (See Comments)    Caused "weird thoughts"   Medications:  Current Outpatient Medications:    albuterol (VENTOLIN HFA) 108 (90 Base) MCG/ACT inhaler, Inhale 2 puffs into the lungs every 6 (six) hours as needed for wheezing or shortness of breath (or chest tightness). , Disp: , Rfl:    cetirizine (ZYRTEC) 10 MG tablet, Take 10 mg by mouth at bedtime., Disp: , Rfl:    diltiazem (CARDIZEM CD) 180 MG 24  hr capsule, Take 1 capsule (180 mg total) by mouth daily., Disp: 90 capsule, Rfl: 3   fluticasone-salmeterol (ADVAIR) 100-50 MCG/ACT AEPB, Inhale 1 puff into the lungs 2 (two) times daily., Disp: 1 each, Rfl: 5   lidocaine (XYLOCAINE) 2 % solution, Use as directed 10 mLs in the mouth or throat as needed for mouth pain., Disp: 100 mL, Rfl: 0   triamcinolone (NASACORT) 55 MCG/ACT AERO nasal inhaler, Place 2 sprays into the nose daily., Disp: 1 Inhaler, Rfl: 6  Observations/Objective: Patient is well-developed, well-nourished in no acute distress.  Resting comfortably  at home.  Head is normocephalic, atraumatic.  No labored breathing.  Speech is clear and coherent with logical content.  Patient is alert and oriented at baseline.    Assessment and Plan: 1. Acute bronchitis with COPD ([place])  - azithromycin (ZITHROMAX) 250 MG tablet; Take 2 tablets on day 1, then 1 tablet daily on days 2 through 5  Dispense: 6 tablet; Refill: 0 - benzonatate (TESSALON) 100 MG capsule; Take 1 capsule (100 mg total) by mouth 3 (three) times daily as needed.  Dispense: 30 capsule; Refill: 0 - predniSONE (DELTASONE) 20 MG tablet; Take 1 tablet (20 mg total) by mouth 2 (two) times daily with a meal for 5 days.  Dispense: 10 tablet; Refill: 0 - albuterol (VENTOLIN HFA) 108 (90 Base) MCG/ACT inhaler; Inhale 2 puffs into the lungs every 6 (six) hours as needed for wheezing or shortness of breath.  Dispense: 8 g; Refill: 0     Follow Up Instructions: I discussed the assessment and treatment plan with the patient. The patient was provided an opportunity to ask questions and all were answered. The patient agreed with the plan and demonstrated an understanding of the instructions.  A copy of instructions were sent to the patient via MyChart unless otherwise noted below.    The patient was advised to call back or seek an in-person evaluation if the symptoms worsen or if the condition fails to improve as anticipated.  Time:   I spent 20 minutes with the patient via telehealth technology discussing the above problems/concerns.    Roberto Schneiders, FNP

## 2022-04-08 ENCOUNTER — Telehealth: Payer: Self-pay | Admitting: Internal Medicine

## 2022-04-08 NOTE — Telephone Encounter (Signed)
Pt called stated he still having a bad could with thick green mucus pt is requesting for some medicine to sent in to his pharmacy. Pt had a video call on 03/29/22 and still having the same issues.  Preferred Pharmacy: [facility] [place], [place]

## 2022-04-09 ENCOUNTER — Telehealth: Payer: Self-pay | Admitting: Nurse Practitioner

## 2022-04-09 DIAGNOSIS — R051 Acute cough: Secondary | ICD-10-CM

## 2022-04-09 DIAGNOSIS — J0100 Acute maxillary sinusitis, unspecified: Secondary | ICD-10-CM

## 2022-04-09 MED ORDER — AMOXICILLIN-POT CLAVULANATE 875-125 MG PO TABS: 1.0000 | ORAL_TABLET | Freq: Two times a day (BID) | ORAL | 0 refills | Status: DC

## 2022-04-09 NOTE — Patient Instructions (Signed)
Roberto Jenkins, thank you for joining Chevis Pretty, FNP for today's virtual visit.  While this provider is not your primary care provider (PCP), if your PCP is located in our provider database this encounter information will be shared with them immediately following your visit.   [facility] account gives you access to today's visit and all your visits, tests, and labs performed at [facility] " click here if you don't have a [facility] account or go to [email or web address]  Consent: (Patient) Roberto Jenkins provided verbal consent for this virtual visit at the beginning of the encounter.  Current Medications:  Current Outpatient Medications:    albuterol (VENTOLIN HFA) 108 (90 Base) MCG/ACT inhaler, Inhale 2 puffs into the lungs every 6 (six) hours as needed for wheezing or shortness of breath (or chest tightness). , Disp: , Rfl:    albuterol (VENTOLIN HFA) 108 (90 Base) MCG/ACT inhaler, Inhale 2 puffs into the lungs every 6 (six) hours as needed for wheezing or shortness of breath., Disp: 8 g, Rfl: 0   amoxicillin-clavulanate (AUGMENTIN) 875-125 MG tablet, Take 1 tablet by mouth 2 (two) times daily., Disp: 14 tablet, Rfl: 0   benzonatate (TESSALON) 100 MG capsule, Take 1 capsule (100 mg total) by mouth 3 (three) times daily as needed., Disp: 30 capsule, Rfl: 0   cetirizine (ZYRTEC) 10 MG tablet, Take 10 mg by mouth at bedtime., Disp: , Rfl:    diltiazem (CARDIZEM CD) 180 MG 24 hr capsule, Take 1 capsule (180 mg total) by mouth daily., Disp: 90 capsule, Rfl: 3   fluticasone-salmeterol (ADVAIR) 100-50 MCG/ACT AEPB, Inhale 1 puff into the lungs 2 (two) times daily., Disp: 1 each, Rfl: 5   lidocaine (XYLOCAINE) 2 % solution, Use as directed 10 mLs in the mouth or throat as needed for mouth pain., Disp: 100 mL, Rfl: 0   triamcinolone (NASACORT) 55 MCG/ACT AERO nasal inhaler, Place 2 sprays into the nose daily., Disp: 1 Inhaler, Rfl: 6   Medications ordered  in this encounter:  Meds ordered this encounter  Medications   DISCONTD: amoxicillin-clavulanate (AUGMENTIN) 875-125 MG tablet    Sig: Take 1 tablet by mouth 2 (two) times daily.    Dispense:  14 tablet    Refill:  0    Order Specific Question:   Supervising Provider    Answer:   Chase Picket [id number]   amoxicillin-clavulanate (AUGMENTIN) 875-125 MG tablet    Sig: Take 1 tablet by mouth 2 (two) times daily.    Dispense:  14 tablet    Refill:  0    Order Specific Question:   Supervising Provider    Answer:   Chase Picket [id number]     *If you need refills on other medications prior to your next appointment, please contact your pharmacy*  Follow-Up: Call back or seek an in-person evaluation if the symptoms worsen or if the condition fails to improve as anticipated.  [facility] [phone]  Other Instructions 1. Take meds as prescribed 2. Use a cool mist humidifier especially during the winter months and when heat has been humid. 3. Use saline nose sprays frequently 4. Saline irrigations of the nose can be very helpful if done frequently.  * 4X daily for 1 week*  * Use of a nettie pot can be helpful with this. Follow directions with this* 5. Drink plenty of fluids 6. Keep thermostat turn down low 7.For any cough or congestion- mucinex 8. For fever or  aces or pains- take tylenol or ibuprofen appropriate for age and weight.  * for fevers greater than 101 orally you may alternate ibuprofen and tylenol every  3 hours.      If you have been instructed to have an in-person evaluation today at a local Urgent Care facility, please use the link below. It will take you to a list of all of our available [facility], including address, phone number and hours of operation. Please do not delay care.  [facility]  If you or a family member do not have a primary care provider, use the link below to schedule a visit and establish care. When  you choose a [facility] primary care physician or advanced practice provider, you gain a long-term partner in health. Find a Primary Care Provider  Learn more about [facility]'s in-office and virtual care options: [facility]

## 2022-04-09 NOTE — Progress Notes (Signed)
Virtual Visit Consent   Roberto Jenkins, you are scheduled for a virtual visit with Roberto Jenkins Done, [facility], a [facility] provider, today.     Just as with appointments in the office, your consent must be obtained to participate.  Your consent will be active for this visit and any virtual visit you may have with one of our providers in the next 365 days.     If you have a MyChart account, a copy of this consent can be sent to you electronically.  All virtual visits are billed to your insurance company just like a traditional visit in the office.    As this is a virtual visit, video technology does not allow for your provider to perform a traditional examination.  This may limit your provider's ability to fully assess your condition.  If your provider identifies any concerns that need to be evaluated in person or the need to arrange testing (such as labs, EKG, etc.), we will make arrangements to do so.     Although advances in technology are sophisticated, we cannot ensure that it will always work on either your end or our end.  If the connection with a video visit is poor, the visit may have to be switched to a telephone visit.  With either a video or telephone visit, we are not always able to ensure that we have a secure connection.     I need to obtain your verbal consent now.   Are you willing to proceed with your visit today? YES   Roberto Jenkins has provided verbal consent on 04/09/2022 for a virtual visit (video or telephone).   Roberto Jenkins Done, FNP   Date: 04/09/2022 6:26 PM   Virtual Visit via Video Note   I, Roberto Jenkins Done, connected with Roberto Jenkins ([id number], 11-13-1960) on 04/09/22 at  6:30 PM EST by a video-enabled telemedicine application and verified that I am speaking with the correct person using two identifiers.  Location: Patient: Virtual Visit Location Patient: Home Provider: Virtual Visit Location Provider: Mobile   I discussed the limitations of  evaluation and management by telemedicine and the availability of in person appointments. The patient expressed understanding and agreed to proceed.    History of Present Illness: Roberto Jenkins is a 62 y.o. who identifies as a male who was assigned male at birth, and is being seen today for bronchitis.  HPI: Patient did video visit last week and was dx with bronchitis. He was given z pak, tessalon perles and prednisone. He says he is still coughing up green phlegm. Still has sinus pressure.      Review of Systems  Constitutional:  Negative for chills and fever.  HENT:  Positive for congestion.   Respiratory:  Positive for cough and sputum production. Negative for shortness of breath.   Neurological:  Negative for dizziness and headaches.    Problems:  Patient Active Problem List   Diagnosis Date Noted   Arthralgia 10/22/2021   Right lower quadrant abdominal pain 10/22/2021   Pain of upper extremity 10/03/2021   Allergic rhinitis 07/17/2020   Palpitations 08/22/2019   Mild persistent asthma without complication [id number]   Chronic obstructive pulmonary disease ([place]) 06/22/2017   Fatty liver 03/28/2017   Hypertension 02/08/2017   Alcohol consumption heavy 02/08/2017   Erectile dysfunction 02/05/2015   Hx of adenomatous polyp of colon 12/02/2014   Hemorrhoid 09/26/2014   Low back pain 09/26/2014    Allergies:  Allergies  Allergen Reactions  Montelukast Other (See Comments)    Caused "weird thoughts"   Medications:  Current Outpatient Medications:    albuterol (VENTOLIN HFA) 108 (90 Base) MCG/ACT inhaler, Inhale 2 puffs into the lungs every 6 (six) hours as needed for wheezing or shortness of breath (or chest tightness). , Disp: , Rfl:    albuterol (VENTOLIN HFA) 108 (90 Base) MCG/ACT inhaler, Inhale 2 puffs into the lungs every 6 (six) hours as needed for wheezing or shortness of breath., Disp: 8 g, Rfl: 0   benzonatate (TESSALON) 100 MG capsule, Take 1 capsule (100 mg  total) by mouth 3 (three) times daily as needed., Disp: 30 capsule, Rfl: 0   cetirizine (ZYRTEC) 10 MG tablet, Take 10 mg by mouth at bedtime., Disp: , Rfl:    diltiazem (CARDIZEM CD) 180 MG 24 hr capsule, Take 1 capsule (180 mg total) by mouth daily., Disp: 90 capsule, Rfl: 3   fluticasone-salmeterol (ADVAIR) 100-50 MCG/ACT AEPB, Inhale 1 puff into the lungs 2 (two) times daily., Disp: 1 each, Rfl: 5   lidocaine (XYLOCAINE) 2 % solution, Use as directed 10 mLs in the mouth or throat as needed for mouth pain., Disp: 100 mL, Rfl: 0   triamcinolone (NASACORT) 55 MCG/ACT AERO nasal inhaler, Place 2 sprays into the nose daily., Disp: 1 Inhaler, Rfl: 6  Observations/Objective: Patient is well-developed, well-nourished in no acute distress.  Resting comfortably  at home.  Head is normocephalic, atraumatic.  No labored breathing.  Speech is clear and coherent with logical content.  Patient is alert and oriented at baseline.  Raspy voice Deep wet cough  Assessment and Plan:  [name] in today with chief complaint of Bronchitis   1. Acute non-recurrent maxillary sinusitis  2. Acute cough 1. Take meds as prescribed 2. Use a cool mist humidifier especially during the winter months and when heat has been humid. 3. Use saline nose sprays frequently 4. Saline irrigations of the nose can be very helpful if done frequently.  * 4X daily for 1 week*  * Use of a nettie pot can be helpful with this. Follow directions with this* 5. Drink plenty of fluids 6. Keep thermostat turn down low 7.For any cough or congestion- mucinex 8. For fever or aces or pains- take tylenol or ibuprofen appropriate for age and weight.  * for fevers greater than 101 orally you may alternate ibuprofen and tylenol every  3 hours.    Meds ordered this encounter  Medications   DISCONTD: amoxicillin-clavulanate (AUGMENTIN) 875-125 MG tablet    Sig: Take 1 tablet by mouth 2 (two) times daily.    Dispense:  14 tablet     Refill:  0    Order Specific Question:   Supervising Provider    Answer:   Chase Picket [id number]   amoxicillin-clavulanate (AUGMENTIN) 875-125 MG tablet    Sig: Take 1 tablet by mouth 2 (two) times daily.    Dispense:  14 tablet    Refill:  0    Order Specific Question:   Supervising Provider    Answer:   Chase Picket [id number]      Follow Up Instructions: I discussed the assessment and treatment plan with the patient. The patient was provided an opportunity to ask questions and all were answered. The patient agreed with the plan and demonstrated an understanding of the instructions.  A copy of instructions were sent to the patient via MyChart.  The patient was advised to call back or seek an in-person  evaluation if the symptoms worsen or if the condition fails to improve as anticipated.  Time:  I spent 6 minutes with the patient via telehealth technology discussing the above problems/concerns.    Roberto Jenkins Done, FNP

## 2022-04-11 NOTE — Telephone Encounter (Signed)
Message left for patient today to complete abx and follow up if not better.  My-chart also sent as well.

## 2022-04-11 NOTE — Telephone Encounter (Signed)
It looks like he had a Z-Pak the end of January and was prescribed Augmentin on 2/10, which he should still be taking.  He should complete the Augmentin, but if that time his symptoms are not improving needs to be seen.

## 2022-04-25 NOTE — Progress Notes (Unsigned)
Subjective:    Patient ID: Roberto Jenkins, male    DOB: 08-12-1960, 62 y.o.   MRN: [id number]      HPI Roberto Jenkins is here for No chief complaint on file.   Seen 04/09/22 - rx'd augmentin bid x 7 days, tessalon   He is here for an acute visit for cold symptoms.  His symptoms started  He is experiencing   He has tried taking      Medications and allergies reviewed with patient and updated if appropriate.  Current Outpatient Medications on File Prior to Visit  Medication Sig Dispense Refill   albuterol (VENTOLIN HFA) 108 (90 Base) MCG/ACT inhaler Inhale 2 puffs into the lungs every 6 (six) hours as needed for wheezing or shortness of breath (or chest tightness).      albuterol (VENTOLIN HFA) 108 (90 Base) MCG/ACT inhaler Inhale 2 puffs into the lungs every 6 (six) hours as needed for wheezing or shortness of breath. 8 g 0   amoxicillin-clavulanate (AUGMENTIN) 875-125 MG tablet Take 1 tablet by mouth 2 (two) times daily. 14 tablet 0   benzonatate (TESSALON) 100 MG capsule Take 1 capsule (100 mg total) by mouth 3 (three) times daily as needed. 30 capsule 0   cetirizine (ZYRTEC) 10 MG tablet Take 10 mg by mouth at bedtime.     diltiazem (CARDIZEM CD) 180 MG 24 hr capsule Take 1 capsule (180 mg total) by mouth daily. 90 capsule 3   fluticasone-salmeterol (ADVAIR) 100-50 MCG/ACT AEPB Inhale 1 puff into the lungs 2 (two) times daily. 1 each 5   lidocaine (XYLOCAINE) 2 % solution Use as directed 10 mLs in the mouth or throat as needed for mouth pain. 100 mL 0   triamcinolone (NASACORT) 55 MCG/ACT AERO nasal inhaler Place 2 sprays into the nose daily. 1 Inhaler 6   No current facility-administered medications on file prior to visit.    Review of Systems     Objective:  There were no vitals filed for this visit. BP Readings from Last 3 Encounters:  11/07/21 (!) 135/97  10/22/21 120/70  10/05/21 126/84   Wt Readings from Last 3 Encounters:  10/22/21 158 lb (71.7 kg)  10/05/21  159 lb (72.1 kg)  06/22/21 160 lb 12.8 oz (72.9 kg)   There is no height or weight on file to calculate BMI.    Physical Exam Constitutional:      General: He is not in acute distress.    Appearance: Normal appearance. He is not ill-appearing.  HENT:     Head: Normocephalic.     Right Ear: Tympanic membrane, ear canal and external ear normal. There is no impacted cerumen.     Left Ear: Tympanic membrane, ear canal and external ear normal. There is no impacted cerumen.     Mouth/Throat:     Mouth: Mucous membranes are moist.     Pharynx: No oropharyngeal exudate or posterior oropharyngeal erythema.  Eyes:     Conjunctiva/sclera: Conjunctivae normal.  Cardiovascular:     Rate and Rhythm: Normal rate and regular rhythm.  Pulmonary:     Effort: Pulmonary effort is normal. No respiratory distress.     Breath sounds: Normal breath sounds. No wheezing or rales.  Musculoskeletal:     Cervical back: Neck supple. No tenderness.  Lymphadenopathy:     Cervical: No cervical adenopathy.  Skin:    General: Skin is warm and dry.     Findings: No rash.  Neurological:  Mental Status: He is alert.            Assessment & Plan:    See Problem List for Assessment and Plan of chronic medical problems.

## 2022-04-26 ENCOUNTER — Encounter: Payer: Self-pay | Admitting: Internal Medicine

## 2022-04-26 ENCOUNTER — Ambulatory Visit (INDEPENDENT_AMBULATORY_CARE_PROVIDER_SITE_OTHER): Payer: 59 | Admitting: Internal Medicine

## 2022-04-26 VITALS — BP 140/78 | HR 81 | Temp 98.2°F | Ht 71.0 in | Wt 162.0 lb

## 2022-04-26 DIAGNOSIS — B009 Herpesviral infection, unspecified: Secondary | ICD-10-CM | POA: Diagnosis not present

## 2022-04-26 DIAGNOSIS — N529 Male erectile dysfunction, unspecified: Secondary | ICD-10-CM

## 2022-04-26 DIAGNOSIS — J45901 Unspecified asthma with (acute) exacerbation: Secondary | ICD-10-CM

## 2022-04-26 DIAGNOSIS — J441 Chronic obstructive pulmonary disease with (acute) exacerbation: Secondary | ICD-10-CM

## 2022-04-26 DIAGNOSIS — J209 Acute bronchitis, unspecified: Secondary | ICD-10-CM

## 2022-04-26 DIAGNOSIS — I10 Essential (primary) hypertension: Secondary | ICD-10-CM

## 2022-04-26 MED ORDER — TADALAFIL 20 MG PO TABS: 10.0000 mg | ORAL_TABLET | ORAL | 11 refills | Status: DC | PRN

## 2022-04-26 MED ORDER — BREZTRI AEROSPHERE 160-9-4.8 MCG/ACT IN AERO: 2.0000 | INHALATION_SPRAY | Freq: Two times a day (BID) | RESPIRATORY_TRACT | 11 refills | Status: DC

## 2022-04-26 MED ORDER — LEVOFLOXACIN 500 MG PO TABS: 500.0000 mg | ORAL_TABLET | Freq: Every day | ORAL | 0 refills | Status: AC

## 2022-04-26 MED ORDER — TRELEGY ELLIPTA 100-62.5-25 MCG/ACT IN AEPB: 1.0000 | INHALATION_SPRAY | Freq: Every day | RESPIRATORY_TRACT | 11 refills | Status: DC

## 2022-04-26 MED ORDER — VALACYCLOVIR HCL 1 G PO TABS: 2000.0000 mg | ORAL_TABLET | Freq: Two times a day (BID) | ORAL | 5 refills | Status: DC

## 2022-04-26 NOTE — Patient Instructions (Addendum)
       Medications changes include :   levaquin 500 mg daily x 5 days.   Samples of breztri and trelegy inhalers      Return if symptoms worsen or fail to improve.

## 2022-04-26 NOTE — Assessment & Plan Note (Signed)
Subacute Has had symptoms for approximately 1 month I think the Z-Pak and the Augmentin for 1 week was not enough to successfully treat this.  The steroids did help.  Inhaler-Wixela-is not helping that much. Still having active infection Start Levaquin 500 mg daily x 5 days Deferred prednisone taper and his lungs are clear so I think that is reasonable Given his combination of COPD and asthma we will try Trelegy/Breztri-sample given for each and he will try each of these to see which works better and then I can prescribe this Continue albuterol as needed He will let me know if there is no improvement

## 2022-04-26 NOTE — Assessment & Plan Note (Signed)
Chronic Would like to try Cialis Cialis 10-20 mg every 48 hours as needed-prescription sent to pharmacy

## 2022-04-26 NOTE — Assessment & Plan Note (Signed)
Acute Acute bronchitis with likely exacerbation of asthma and COPD Stressed smoking cessation Will treat infection with Levaquin 500 mg daily x 7 days Deferred prednisone taper which I think is reasonable-lungs sound pretty good here today Trial of Trelegy or Breztri-samples given in both for him to try.  He is currently using Wixela and it is not working that well now and even when he is not sick Continue albuterol as needed He will call if no improvement

## 2022-04-26 NOTE — Assessment & Plan Note (Signed)
Chronic Blood pressure is slightly elevated here today-possibly because he is sick Stressed that he needs to be monitoring his blood pressure at home Continue diltiazem 180 mg daily

## 2022-04-26 NOTE — Assessment & Plan Note (Signed)
Chronic Intermittent cold sores Needs renewal of his medication Valtrex 2000 mg every 12 hours x 1 day sent to pharmacy (as needed

## 2022-05-08 MED ORDER — TRELEGY ELLIPTA 100-62.5-25 MCG/ACT IN AEPB: 1.0000 | INHALATION_SPRAY | Freq: Every day | RESPIRATORY_TRACT | 11 refills | Status: DC

## 2022-07-04 ENCOUNTER — Other Ambulatory Visit: Payer: Self-pay | Admitting: Cardiology

## 2022-09-07 ENCOUNTER — Telehealth: Payer: Self-pay | Admitting: Cardiology

## 2022-09-07 NOTE — Telephone Encounter (Signed)
Pt c/o BP issue:  1. What are your last 5 BP readings? 158/98, 157/98, 152/97, 156/97  2. Are you having any other symptoms (ex. Dizziness, headache, blurred vision, passed out)? Headaches  3. What is your medication issue? High blood pressure- patient wanted an appointment- I made him one for tomorrow with Jesse-(09-08-22) please call to evaluated

## 2022-09-07 NOTE — Progress Notes (Unsigned)
Cardiology Clinic Note   Patient Name: Roberto Jenkins Date of Encounter: 09/08/2022  Primary Care Provider:  Pincus Sanes, MD Primary Cardiologist:  Roberto Rotunda, MD  Patient Profile    Roberto Jenkins 62 year old male presents to the clinic today for follow-up evaluation of his hypertension.  Past Medical History    Past Medical History:  Diagnosis Date   Asthma    Hx of adenomatous polyp of colon 12/02/2014   Past Surgical History:  Procedure Laterality Date   COLONOSCOPY  2004   in [place];pt doesn't know MD name.   HERNIA REPAIR Bilateral    NOSE SURGERY     x2    Allergies  Allergies  Allergen Reactions   Montelukast Other (See Comments)    Caused "weird thoughts"    History of Present Illness    Roberto Jenkins has a PMH of HTN, acute bronchitis, chronic obstructive pulmonary disease, allergic rhinitis, asthma, low back pain, ED, palpitations, heavy alcohol consumption, and right lower quadrant abdominal pain.  His coronary calcium score was 0 with no CAD noted on CT.  He also had a negative exercise treadmill test in 2019.  He was seen in follow-up by Dr. Antoine Jenkins on 10/05/2021.  He had been switched to diltiazem for blood pressure control.  He denied further palpitations.  He wore a cardiac event monitor which showed no significant arrhythmia.  He did report tingling in his arms and discomfort in his neck.  He denied chest pain and arm discomfort.  He denied presyncope and syncope.  His weight was stable and he had no lower extremity edema.  It was felt that his arm pain was atypical in nature.  It was felt that he may have some cervical disc issue.  He presents to the clinic today for follow-up evaluation and states over the last few weeks he has noticed elevated blood pressures.  He also has noticed headaches.  He began to monitor his blood pressure and noted blood pressures in the 150 systolic.  Today in the clinic his blood pressure is 148/84.  On recheck  it is 138/78.  We reviewed the importance of avoiding NSAIDs, salt, and previous blood pressure medications.  I will give him a blood pressure log, salty 6 diet sheet, and start valsartan 40 mg daily.  I will plan for c-Met and CBC in 1 week and plan follow-up in 1 to 2 months.  He also reports floaters in his right eye.  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  I instructed him to follow-up with his optometrist.  Today he denies chest pain,  lower extremity edema, fatigue, palpitations, melena, hematuria, hemoptysis, diaphoresis, weakness, presyncope, syncope, orthopnea, and PND.    Home Medications    Prior to Admission medications   Medication Sig Start Date End Date Taking? Authorizing Provider  albuterol (VENTOLIN HFA) 108 (90 Base) MCG/ACT inhaler Inhale 2 puffs into the lungs every 6 (six) hours as needed for wheezing or shortness of breath. 03/29/22   Viviano Simas, FNP  benzonatate (TESSALON) 100 MG capsule Take 1 capsule (100 mg total) by mouth 3 (three) times daily as needed. 03/29/22   Viviano Simas, FNP  Budeson-Glycopyrrol-Formoterol (BREZTRI AEROSPHERE) 160-9-4.8 MCG/ACT AERO Inhale 2 puffs into the lungs 2 (two) times daily. 04/26/22   Roberto Sanes, MD  cetirizine (ZYRTEC) 10 MG tablet Take 10 mg by mouth at bedtime.    [provider]  diltiazem (CARDIZEM CD) 180 MG  24 hr capsule TAKE ONE CAPSULE BY MOUTH DAILY 07/04/22   Roberto Rotunda, MD  Fluticasone-Umeclidin-Vilant (TRELEGY ELLIPTA) 100-62.5-25 MCG/ACT AEPB Inhale 1 puff into the lungs daily. 05/08/22   Roberto Sanes, MD  lidocaine (XYLOCAINE) 2 % solution Use as directed 10 mLs in the mouth or throat as needed for mouth pain. 12/19/20   Particia Nearing, PA-C  tadalafil (CIALIS) 20 MG tablet Take 0.5-1 tablets (10-20 mg total) by mouth every other day as needed for erectile dysfunction. 04/26/22   Roberto Sanes, MD  triamcinolone (NASACORT) 55 MCG/ACT AERO nasal inhaler Place 2 sprays into the nose  daily. 05/31/17   Olive Bass, FNP  valACYclovir (VALTREX) 1000 MG tablet Take 2 tablets (2,000 mg total) by mouth 2 (two) times daily. Use for one day for outbreak 04/26/22   Roberto Sanes, MD    Family History    Family History  Adopted: Yes  Family history unknown: Yes   is adopted.   Social History    Social History   Socioeconomic History   Marital status: Divorced    Spouse name: Not on file   Number of children: 1   Years of education: 14   Highest education level: Not on file  Occupational History   Occupation: Bartender  Tobacco Use   Smoking status: Some Days    Current packs/day: 1.00    Average packs/day: 1 pack/day for 38.0 years (38.0 ttl pk-yrs)    Types: Cigarettes   Smokeless tobacco: Never  Substance and Sexual Activity   Alcohol use: Yes    Alcohol/week: 5.0 standard drinks of alcohol    Types: 5 Shots of liquor per week   Drug use: Yes    Types: Amphetamines, LSD, Other-see comments, Marijuana    Comment: once every 3 months; occasionally, uses mushrooms.    Sexual activity: Not on file  Other Topics Concern   Not on file  Social History Narrative   Fun: Go to shows, occasionally workout.    Denies religious beliefs effecting health care.    Social Determinants of Health   Financial Resource Strain: Not on file  Food Insecurity: Not on file  Transportation Needs: Not on file  Physical Activity: Not on file  Stress: Not on file  Social Connections: Not on file  Intimate Partner Violence: Not on file     Review of Systems    General:  No chills, fever, night sweats or weight changes.  Cardiovascular:  No chest pain, dyspnea on exertion, edema, orthopnea, palpitations, paroxysmal nocturnal dyspnea. Dermatological: No rash, lesions/masses Respiratory: No cough, dyspnea Urologic: No hematuria, dysuria Abdominal:   No nausea, vomiting, diarrhea, bright red blood per rectum, melena, or hematemesis Neurologic:  No visual changes,  wkns, changes in mental status. All other systems reviewed and are otherwise negative except as noted above.  Physical Exam    VS:  BP 138/78   Pulse 73   Ht 5\' 11"  (1.803 m)   Wt 161 lb 12.8 oz (73.4 kg)   SpO2 99%   BMI 22.57 kg/m  , BMI Body mass index is 22.57 kg/m. GEN: Well nourished, well developed, in no acute distress. HEENT: normal. Neck: Supple, no JVD, carotid bruits, or masses. Cardiac: RRR, no murmurs, rubs, or gallops. No clubbing, cyanosis, edema.  Radials/DP/PT 2+ and equal bilaterally.  Respiratory:  Respirations regular and unlabored, clear to auscultation bilaterally. GI: Soft, nontender, nondistended, BS + x 4. MS: no deformity or atrophy. Skin: warm and dry,  no rash. Neuro:  Strength and sensation are intact. Psych: Normal affect.  Accessory Clinical Findings    Recent Labs: 10/22/2021: TSH 2.56 11/07/2021: ALT 62; BUN 6; Creatinine, Ser 0.92; Hemoglobin 14.6; Platelets 186; Potassium 3.7; Sodium 136   Recent Lipid Panel    Component Value Date/Time   CHOL 217 (H) 10/22/2021 1424   TRIG 61.0 10/22/2021 1424   HDL 124.40 10/22/2021 1424   CHOLHDL 2 10/22/2021 1424   VLDL 12.2 10/22/2021 1424   LDLCALC 81 10/22/2021 1424         ECG personally reviewed by me today- none today.   - No acute changes  Cardiac event monitor 07/06/2021  Normal sinus rhythm Rare supraventricular ectopy with few couplets and triplets.   No sustained arrhythmias.  Exercise treadmill test 03/14/2017 Blood pressure demonstrated a normal response to exercise. There was no ST segment deviation noted during stress.   No ischemia. Excellent exercise capacity.  Normal BP response to exertion.     Assessment & Plan   1.  Essential hypertension-BP today 148/84. Maintain blood pressure log Heart healthy low-sodium diet-salty six Continue diltiazem Start valsartan 40 mg daily Increase physical activity as tolerated BMP and CBC in 1 week  Palpitations-denies recent  episodes of accelerated or irregular heartbeat.  Previously wore cardiac event monitor which showed no significant arrhythmias. Avoid triggers caffeine, chocolate, EtOH, dehydration etc. Continue diltiazem  Tobacco use-continues to smoke per day.  Smoking cessation strongly recommended. Smoking cessation information given  Disposition: Follow-up with Dr. Antoine Jenkins or me in 1-2 months.   Thomasene Ripple. Shanikka Wonders NP-C     09/08/2022, 2:34 PM [facility] [address] Office [phone] Fax [phone]    I spent 14 minutes examining this patient, reviewing medications, and using patient centered shared decision making involving her cardiac care.  Prior to her visit I spent greater than 20 minutes reviewing her past medical history,  medications, and prior cardiac tests.

## 2022-09-07 NOTE — Telephone Encounter (Signed)
Patient calling in to discuss elevated BP, has appt w/ Edd Fabian tomorrow.   Patient states that his blood pressure have been elevated and he has noticed frequent headaches over the last month. He takes his cardizem early morning every day. Today at 12:45 his BP was 158/98. Yesterday, his BP was 157/98, and the day before it was 152/97. He does not have a way to measure his HR at home. He states that he has been taking ibuprofen to alleviate his headache pain. He also reports he has noticed brief palpitations over the past month as well. He states the palpitations seem to come on whether  he is sitting or exerting himself pass quickly. He does report that with the hotter weather he has had to use his albuterol inhaler for his asthma more often.   Advised patient to call 911 if he experiences sudden onset of severe head pain and vision changes or difficulty breathing. Patient verbalizes understanding.

## 2022-09-08 ENCOUNTER — Ambulatory Visit: Payer: 59 | Attending: General Practice | Admitting: General Practice

## 2022-09-08 ENCOUNTER — Encounter: Payer: Self-pay | Admitting: General Practice

## 2022-09-08 VITALS — BP 138/78 | HR 73 | Ht 71.0 in | Wt 161.8 lb

## 2022-09-08 DIAGNOSIS — I10 Essential (primary) hypertension: Secondary | ICD-10-CM | POA: Diagnosis not present

## 2022-09-08 DIAGNOSIS — R002 Palpitations: Secondary | ICD-10-CM | POA: Diagnosis not present

## 2022-09-08 DIAGNOSIS — Z720 Tobacco use: Secondary | ICD-10-CM

## 2022-09-08 MED ORDER — VALSARTAN 40 MG PO TABS: 40.0000 mg | ORAL_TABLET | Freq: Every day | ORAL | 3 refills | Status: DC

## 2022-09-08 NOTE — Patient Instructions (Addendum)
Medication Instructions:  START VALSARTAN 40MG  DAILY *If you need a refill on your cardiac medications before your next appointment, please call your pharmacy*  Lab Work: CBC AND BMET IN 1-2 WEEKS If you have labs (blood work) drawn today and your tests are completely normal, you will receive your results only by:  MyChart Message (if you have MyChart) OR  A paper copy in the mail If you have any lab test that is abnormal or we need to change your treatment, we will call you to review the results.  Testing/Procedures: NONE  Follow-Up: At [facility], you and your health needs are our priority.  As part of our continuing mission to provide you with exceptional heart care, we have created designated Provider Care Teams.  These Care Teams include your primary Cardiologist (physician) and Advanced Practice Providers (APPs -  Physician Assistants and Nurse Practitioners) who all work together to provide you with the care you need, when you need it.  Your next appointment:   1-2 month(s)  Provider:   Edd Fabian, FNP-C  Other Instructions INCREASE HYDRATION FOLLOW UP WITH OPHTHALMOLOGY  PLEASE READ AND FOLLOW ATTACHED  SALTY 6  PLEASE READ AND FOLLOW ATTACHED  CESSATION TIPS       Steps to Quit Smoking Smoking tobacco is the leading cause of preventable death. It can affect almost every organ in the body. Smoking puts you and people around you at risk for many serious, long-lasting (chronic) diseases. Quitting smoking can be hard, but it is one of the best things that you can do for your health. It is never too late to quit. Do not give up if you cannot quit the first time. Some people need to try many times to quit. Do your best to stick to your quit plan, and talk with your doctor if you have any questions or concerns. How do I get ready to quit? Pick a date to quit. Set a date within the next 2 weeks to give you time to prepare. Write down the reasons why you are  quitting. Keep this list in places where you will see it often. Tell your family, friends, and co-workers that you are quitting. Their support is important. Talk with your doctor about the choices that may help you quit. Find out if your health insurance will pay for these treatments. Know the people, places, things, and activities that make you want to smoke (triggers). Avoid them. What first steps can I take to quit smoking? Throw away all cigarettes at home, at work, and in your car. Throw away the things that you use when you smoke, such as ashtrays and lighters. Clean your car. Empty the ashtray. Clean your home, including curtains and carpets. What can I do to help me quit smoking? Talk with your doctor about taking medicines and seeing a counselor. You are more likely to succeed when you do both. If you are pregnant or breastfeeding: Talk with your doctor about counseling or other ways to quit smoking. Do not take medicine to help you quit smoking unless your doctor tells you to. Quit right away Quit smoking completely, instead of slowly cutting back on how much you smoke over a period of time. Stopping smoking right away may be more successful than slowly quitting. Go to counseling. In-person is best if this is an option. You are more likely to quit if you go to counseling sessions regularly. Take medicine You may take medicines to help you quit. Some medicines  need a prescription, and some you can buy over-the-counter. Some medicines may contain a drug called nicotine to replace the nicotine in cigarettes. Medicines may: Help you stop having the desire to smoke (cravings). Help to stop the problems that come when you stop smoking (withdrawal symptoms). Your doctor may ask you to use: Nicotine patches, gum, or lozenges. Nicotine inhalers or sprays. Non-nicotine medicine that you take by mouth. Find resources Find resources and other ways to help you quit smoking and remain  smoke-free after you quit. They include: Online chats with a Veterinary surgeon. Phone quitlines. Printed Materials engineer. Support groups or group counseling. Text messaging programs. Mobile phone apps. Use apps on your mobile phone or tablet that can help you stick to your quit plan. Examples of free services include Quit Guide from the CDC and smokefree.gov  What can I do to make it easier to quit?  Talk to your family and friends. Ask them to support and encourage you. Call a phone quitline, such as 1-800-QUIT-NOW, reach out to support groups, or work with a Veterinary surgeon. Ask people who smoke to not smoke around you. Avoid places that make you want to smoke, such as: Bars. Parties. Smoke-break areas at work. Spend time with people who do not smoke. Lower the stress in your life. Stress can make you want to smoke. Try these things to lower stress: Getting regular exercise. Doing deep-breathing exercises. Doing yoga. Meditating. What benefits will I see if I quit smoking? Over time, you may have: A better sense of smell and taste. Less coughing and sore throat. A slower heart rate. Lower blood pressure. Clearer skin. Better breathing. Fewer sick days. Summary Quitting smoking can be hard, but it is one of the best things that you can do for your health. Do not give up if you cannot quit the first time. Some people need to try many times to quit. When you decide to quit smoking, make a plan to help you succeed. Quit smoking right away, not slowly over a period of time. When you start quitting, get help and support to keep you smoke-free. This information is not intended to replace advice given to you by your health care provider. Make sure you discuss any questions you have with your health care provider. Document Revised: 02/05/2021 Document Reviewed: 02/05/2021 Elsevier Patient Education  2024 [employer].

## 2022-09-16 LAB — BASIC METABOLIC PANEL
BUN/Creatinine Ratio: 9 — ABNORMAL LOW (ref 10–24)
BUN: 7 mg/dL — ABNORMAL LOW (ref 8–27)
CO2: 26 mmol/L (ref 20–29)
Calcium: 10.8 mg/dL — ABNORMAL HIGH (ref 8.6–10.2)
Chloride: 97 mmol/L (ref 96–106)
Creatinine, Ser: 0.74 mg/dL — ABNORMAL LOW (ref 0.76–1.27)
Glucose: 98 mg/dL (ref 70–99)
Potassium: 4.6 mmol/L (ref 3.5–5.2)
Sodium: 139 mmol/L (ref 134–144)
eGFR: 102 mL/min/{1.73_m2} (ref >59)

## 2022-09-16 LAB — CBC
Hematocrit: 41.7 % (ref 37.5–51.0)
Hemoglobin: 14.9 g/dL (ref 13.0–17.7)
MCH: 31.8 pg (ref 26.6–33.0)
MCHC: 35.7 g/dL (ref 31.5–35.7)
MCV: 89 fL (ref 79–97)
Platelets: 247 10*3/uL (ref 150–450)
RBC: 4.69 x10E6/uL (ref 4.14–5.80)
RDW: 12.1 % (ref 11.6–15.4)
WBC: 6.6 10*3/uL (ref 3.4–10.8)

## 2022-10-21 NOTE — Progress Notes (Deleted)
Cardiology Clinic Note   Patient Name: Roberto Jenkins Date of Encounter: 10/21/2022  Primary Care Provider:  Pincus Sanes, MD Primary Cardiologist:  Rollene Rotunda, MD  Patient Profile    Roberto Jenkins 62 year old male presents to the clinic today for follow-up evaluation of his hypertension.  Past Medical History    Past Medical History:  Diagnosis Date   Asthma    Hx of adenomatous polyp of colon 12/02/2014   Past Surgical History:  Procedure Laterality Date   COLONOSCOPY  2004   in [place];pt doesn't know MD name.   HERNIA REPAIR Bilateral    NOSE SURGERY     x2    Allergies  Allergies  Allergen Reactions   Montelukast Other (See Comments)    Caused "weird thoughts"    History of Present Illness    Roberto Jenkins has a PMH of HTN, acute bronchitis, chronic obstructive pulmonary disease, allergic rhinitis, asthma, low back pain, ED, palpitations, heavy alcohol consumption, and right lower quadrant abdominal pain.  His coronary calcium score was 0 with no CAD noted on CT.  He also had a negative exercise treadmill test in 2019.  He was seen in follow-up by Dr. Antoine Poche on 10/05/2021.  He had been switched to diltiazem for blood pressure control.  He denied further palpitations.  He wore a cardiac event monitor which showed no significant arrhythmia.  He did report tingling in his arms and discomfort in his neck.  He denied chest pain and arm discomfort.  He denied presyncope and syncope.  His weight was stable and he had no lower extremity edema.  It was felt that his arm pain was atypical in nature.  It was felt that he may have some cervical disc issue.  He presents to the clinic today for follow-up evaluation and states over the last few weeks he has noticed elevated blood pressures.  He also has noticed headaches.  He began to monitor his blood pressure and noted blood pressures in the 150 systolic.  Today in the clinic his blood pressure is 148/84.  On recheck  it is 138/78.  We reviewed the importance of avoiding NSAIDs, salt, and previous blood pressure medications.  I will give him a blood pressure log, salty 6 diet sheet, and start valsartan 40 mg daily.  I will plan for c-Met and CBC in 1 week and plan follow-up in 1 to 2 months.  He also reports floaters in his right eye.  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  I instructed him to follow-up with his optometrist.  Today he denies chest pain,  lower extremity edema, fatigue, palpitations, melena, hematuria, hemoptysis, diaphoresis, weakness, presyncope, syncope, orthopnea, and PND.    Home Medications    Prior to Admission medications   Medication Sig Start Date End Date Taking? Authorizing Provider  albuterol (VENTOLIN HFA) 108 (90 Base) MCG/ACT inhaler Inhale 2 puffs into the lungs every 6 (six) hours as needed for wheezing or shortness of breath. 03/29/22   Viviano Simas, FNP  benzonatate (TESSALON) 100 MG capsule Take 1 capsule (100 mg total) by mouth 3 (three) times daily as needed. 03/29/22   Viviano Simas, FNP  Budeson-Glycopyrrol-Formoterol (BREZTRI AEROSPHERE) 160-9-4.8 MCG/ACT AERO Inhale 2 puffs into the lungs 2 (two) times daily. 04/26/22   Pincus Sanes, MD  cetirizine (ZYRTEC) 10 MG tablet Take 10 mg by mouth at bedtime.    [provider]  diltiazem (CARDIZEM CD) 180 MG  24 hr capsule TAKE ONE CAPSULE BY MOUTH DAILY 07/04/22   Rollene Rotunda, MD  Fluticasone-Umeclidin-Vilant (TRELEGY ELLIPTA) 100-62.5-25 MCG/ACT AEPB Inhale 1 puff into the lungs daily. 05/08/22   Pincus Sanes, MD  lidocaine (XYLOCAINE) 2 % solution Use as directed 10 mLs in the mouth or throat as needed for mouth pain. 12/19/20   Particia Nearing, PA-C  tadalafil (CIALIS) 20 MG tablet Take 0.5-1 tablets (10-20 mg total) by mouth every other day as needed for erectile dysfunction. 04/26/22   Pincus Sanes, MD  triamcinolone (NASACORT) 55 MCG/ACT AERO nasal inhaler Place 2 sprays into the nose  daily. 05/31/17   Olive Bass, FNP  valACYclovir (VALTREX) 1000 MG tablet Take 2 tablets (2,000 mg total) by mouth 2 (two) times daily. Use for one day for outbreak 04/26/22   Pincus Sanes, MD    Family History    Family History  Adopted: Yes  Family history unknown: Yes   is adopted.   Social History    Social History   Socioeconomic History   Marital status: Divorced    Spouse name: Not on file   Number of children: 1   Years of education: 14   Highest education level: Not on file  Occupational History   Occupation: Bartender  Tobacco Use   Smoking status: Some Days    Current packs/day: 1.00    Average packs/day: 1 pack/day for 38.0 years (38.0 ttl pk-yrs)    Types: Cigarettes   Smokeless tobacco: Never  Substance and Sexual Activity   Alcohol use: Yes    Alcohol/week: 5.0 standard drinks of alcohol    Types: 5 Shots of liquor per week   Drug use: Yes    Types: Amphetamines, LSD, Other-see comments, Marijuana    Comment: once every 3 months; occasionally, uses mushrooms.    Sexual activity: Not on file  Other Topics Concern   Not on file  Social History Narrative   Fun: Go to shows, occasionally workout.    Denies religious beliefs effecting health care.    Social Determinants of Health   Financial Resource Strain: Not on file  Food Insecurity: Not on file  Transportation Needs: Not on file  Physical Activity: Not on file  Stress: Not on file  Social Connections: Not on file  Intimate Partner Violence: Not on file     Review of Systems    General:  No chills, fever, night sweats or weight changes.  Cardiovascular:  No chest pain, dyspnea on exertion, edema, orthopnea, palpitations, paroxysmal nocturnal dyspnea. Dermatological: No rash, lesions/masses Respiratory: No cough, dyspnea Urologic: No hematuria, dysuria Abdominal:   No nausea, vomiting, diarrhea, bright red blood per rectum, melena, or hematemesis Neurologic:  No visual changes,  wkns, changes in mental status. All other systems reviewed and are otherwise negative except as noted above.  Physical Exam    VS:  There were no vitals taken for this visit. , BMI There is no height or weight on file to calculate BMI. GEN: Well nourished, well developed, in no acute distress. HEENT: normal. Neck: Supple, no JVD, carotid bruits, or masses. Cardiac: RRR, no murmurs, rubs, or gallops. No clubbing, cyanosis, edema.  Radials/DP/PT 2+ and equal bilaterally.  Respiratory:  Respirations regular and unlabored, clear to auscultation bilaterally. GI: Soft, nontender, nondistended, BS + x 4. MS: no deformity or atrophy. Skin: warm and dry, no rash. Neuro:  Strength and sensation are intact. Psych: Normal affect.  Accessory Clinical Findings  Recent Labs: 10/22/2021: TSH 2.56 11/07/2021: ALT 62 09/15/2022: BUN 7; Creatinine, Ser 0.74; Hemoglobin 14.9; Platelets 247; Potassium 4.6; Sodium 139   Recent Lipid Panel    Component Value Date/Time   CHOL 217 (H) 10/22/2021 1424   TRIG 61.0 10/22/2021 1424   HDL 124.40 10/22/2021 1424   CHOLHDL 2 10/22/2021 1424   VLDL 12.2 10/22/2021 1424   LDLCALC 81 10/22/2021 1424    No BP recorded.  {Refresh Note OR Click here to enter BP  :1}***    ECG personally reviewed by me today- none today.   - No acute changes  Cardiac event monitor 07/06/2021  Normal sinus rhythm Rare supraventricular ectopy with few couplets and triplets.   No sustained arrhythmias.  Exercise treadmill test 03/14/2017 Blood pressure demonstrated a normal response to exercise. There was no ST segment deviation noted during stress.   No ischemia. Excellent exercise capacity.  Normal BP response to exertion.     Assessment & Plan   1.  Essential hypertension-BP today 148/84. Maintain blood pressure log Heart healthy low-sodium diet-salty six Continue diltiazem Start valsartan 40 mg daily Increase physical activity as tolerated BMP and CBC in 1  week  Palpitations-denies recent episodes of accelerated or irregular heartbeat.  Previously wore cardiac event monitor which showed no significant arrhythmias. Avoid triggers caffeine, chocolate, EtOH, dehydration etc. Continue diltiazem  Tobacco use-continues to smoke per day.  Smoking cessation strongly recommended. Smoking cessation information given  Disposition: Follow-up with Dr. Antoine Poche or me in 1-2 months.   Thomasene Ripple. Kalei Meda NP-C     10/21/2022, 7:33 AM [facility] [address] Office [phone] Fax [phone]    I spent 14*** minutes examining this patient, reviewing medications, and using patient centered shared decision making involving her cardiac care.  Prior to her visit I spent greater than 20 minutes reviewing her past medical history,  medications, and prior cardiac tests.

## 2022-10-24 ENCOUNTER — Ambulatory Visit: Payer: 59 | Admitting: General Practice

## 2022-11-02 NOTE — Progress Notes (Unsigned)
Cardiology Clinic Note   Patient Name: Roberto Jenkins Date of Encounter: 11/07/2022  Primary Care Provider:  Pincus Sanes, MD Primary Cardiologist:  Rollene Rotunda, MD  Patient Profile    Roberto Jenkins 62 year old male presents to the clinic today for follow-up evaluation of his hypertension.  Past Medical History    Past Medical History:  Diagnosis Date   Asthma    Hx of adenomatous polyp of colon 12/02/2014   Past Surgical History:  Procedure Laterality Date   COLONOSCOPY  2004   in [place];pt doesn't know MD name.   HERNIA REPAIR Bilateral    NOSE SURGERY     x2    Allergies  Allergies  Allergen Reactions   Montelukast Other (See Comments)    Caused "weird thoughts"    History of Present Illness    Roberto Jenkins has a PMH of HTN, acute bronchitis, chronic obstructive pulmonary disease, allergic rhinitis, asthma, low back pain, ED, palpitations, heavy alcohol consumption, and right lower quadrant abdominal pain.  His coronary calcium score was 0 with no CAD noted on CT.  He also had a negative exercise treadmill test in 2019.  He was seen in follow-up by Dr. Antoine Poche on 10/05/2021.  He had been switched to diltiazem for blood pressure control.  He denied further palpitations.  He wore a cardiac event monitor which showed no significant arrhythmia.  He did report tingling in his arms and discomfort in his neck.  He denied chest pain and arm discomfort.  He denied presyncope and syncope.  His weight was stable and he had no lower extremity edema.  It was felt that his arm pain was atypical in nature.  It was felt that he may have some cervical disc issue.  He presented to the clinic  09/08/22 for follow-up evaluation and stated over the last few weeks he had noticed elevated blood pressures.  He also had noticed headaches.  He began to monitor his blood pressure and noted blood pressures in the 150 systolic.  In the clinic his blood pressure was 148/84.  On recheck it  was 138/78.  We reviewed the importance of avoiding NSAIDs, salt, and previous blood pressure medications.  I  gave him a blood pressure log, salty 6 diet sheet, and started valsartan 40 mg daily.  I will plan for c-Met and CBC in 1 week and plan follow-up in 1 to 2 months.  He also reported floaters in his right eye.  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  I instructed him to follow-up with his optometrist.  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  We reviewed his medication.  At home his blood pressure has been well-controlled.  Initially in the office today his blood pressure is 142/84 and on recheck is 134/82.  He had not yet taken his blood pressure medication this morning.  We reviewed his follow-up lab work.  I will continue his current medication regimen, give him the salty 6 diet sheet, have him maintain his physical activity and plan follow-up in 12 months.  We will give him 90-day supply of his valsartan.  Today he denies chest pain,  lower extremity edema, fatigue, palpitations, melena, hematuria, hemoptysis, diaphoresis, weakness, presyncope, syncope, orthopnea, and PND.    Home Medications    Prior to Admission medications   Medication Sig Start Date End Date Taking? Authorizing  Provider  albuterol (VENTOLIN HFA) 108 (90 Base) MCG/ACT inhaler Inhale 2 puffs into the lungs every 6 (six) hours as needed for wheezing or shortness of breath. 03/29/22   Viviano Simas, FNP  benzonatate (TESSALON) 100 MG capsule Take 1 capsule (100 mg total) by mouth 3 (three) times daily as needed. 03/29/22   Viviano Simas, FNP  Budeson-Glycopyrrol-Formoterol (BREZTRI AEROSPHERE) 160-9-4.8 MCG/ACT AERO Inhale 2 puffs into the lungs 2 (two) times daily. 04/26/22   Pincus Sanes, MD  cetirizine (ZYRTEC) 10 MG tablet Take 10 mg by mouth at bedtime.    [provider]  diltiazem (CARDIZEM CD) 180 MG  24 hr capsule TAKE ONE CAPSULE BY MOUTH DAILY 07/04/22   Rollene Rotunda, MD  Fluticasone-Umeclidin-Vilant (TRELEGY ELLIPTA) 100-62.5-25 MCG/ACT AEPB Inhale 1 puff into the lungs daily. 05/08/22   Pincus Sanes, MD  lidocaine (XYLOCAINE) 2 % solution Use as directed 10 mLs in the mouth or throat as needed for mouth pain. 12/19/20   Particia Nearing, PA-C  tadalafil (CIALIS) 20 MG tablet Take 0.5-1 tablets (10-20 mg total) by mouth every other day as needed for erectile dysfunction. 04/26/22   Pincus Sanes, MD  triamcinolone (NASACORT) 55 MCG/ACT AERO nasal inhaler Place 2 sprays into the nose daily. 05/31/17   Olive Bass, FNP  valACYclovir (VALTREX) 1000 MG tablet Take 2 tablets (2,000 mg total) by mouth 2 (two) times daily. Use for one day for outbreak 04/26/22   Pincus Sanes, MD    Family History    Family History  Adopted: Yes  Family history unknown: Yes   is adopted.   Social History    Social History   Socioeconomic History   Marital status: Divorced    Spouse name: Not on file   Number of children: 1   Years of education: 14   Highest education level: Not on file  Occupational History   Occupation: Bartender  Tobacco Use   Smoking status: Some Days    Current packs/day: 1.00    Average packs/day: 1 pack/day for 38.0 years (38.0 ttl pk-yrs)    Types: Cigarettes   Smokeless tobacco: Never  Substance and Sexual Activity   Alcohol use: Yes    Alcohol/week: 5.0 standard drinks of alcohol    Types: 5 Shots of liquor per week   Drug use: Yes    Types: Amphetamines, LSD, Other-see comments, Marijuana    Comment: once every 3 months; occasionally, uses mushrooms.    Sexual activity: Not on file  Other Topics Concern   Not on file  Social History Narrative   Fun: Go to shows, occasionally workout.    Denies religious beliefs effecting health care.    Social Determinants of Health   Financial Resource Strain: Not on file  Food Insecurity: Not on file   Transportation Needs: Not on file  Physical Activity: Not on file  Stress: Not on file  Social Connections: Not on file  Intimate Partner Violence: Not on file     Review of Systems    General:  No chills, fever, night sweats or weight changes.  Cardiovascular:  No chest pain, dyspnea on exertion, edema, orthopnea, palpitations, paroxysmal nocturnal dyspnea. Dermatological: No rash, lesions/masses Respiratory: No cough, dyspnea Urologic: No hematuria, dysuria Abdominal:   No nausea, vomiting, diarrhea, bright red blood per rectum, melena, or hematemesis Neurologic:  No visual changes, wkns, changes in mental status. All other systems reviewed and are otherwise negative except as noted above.  Physical Exam  VS:  BP 134/82   Pulse 70   Ht 5\' 11"  (1.803 m)   Wt 169 lb 9.6 oz (76.9 kg)   SpO2 97%   BMI 23.65 kg/m  , BMI Body mass index is 23.65 kg/m. GEN: Well nourished, well developed, in no acute distress. HEENT: normal. Neck: Supple, no JVD, carotid bruits, or masses. Cardiac: RRR, no murmurs, rubs, or gallops. No clubbing, cyanosis, edema.  Radials/DP/PT 2+ and equal bilaterally.  Respiratory:  Respirations regular and unlabored, clear to auscultation bilaterally. GI: Soft, nontender, nondistended, BS + x 4. MS: no deformity or atrophy. Skin: warm and dry, no rash. Neuro:  Strength and sensation are intact. Psych: Normal affect.  Accessory Clinical Findings    Recent Labs: 09/15/2022: BUN 7; Creatinine, Ser 0.74; Hemoglobin 14.9; Platelets 247; Potassium 4.6; Sodium 139   Recent Lipid Panel    Component Value Date/Time   CHOL 217 (H) 10/22/2021 1424   TRIG 61.0 10/22/2021 1424   HDL 124.40 10/22/2021 1424   CHOLHDL 2 10/22/2021 1424   VLDL 12.2 10/22/2021 1424   LDLCALC 81 10/22/2021 1424         ECG personally reviewed by me today- none today.   - No acute changes  Cardiac event monitor 07/06/2021  Normal sinus rhythm Rare supraventricular ectopy  with few couplets and triplets.   No sustained arrhythmias.  Exercise treadmill test 03/14/2017 Blood pressure demonstrated a normal response to exercise. There was no ST segment deviation noted during stress.   No ischemia. Excellent exercise capacity.  Normal BP response to exertion.     Assessment & Plan   1.  Essential hypertension-BP today 134/82. Maintain blood pressure log Heart healthy low-sodium diet-salty six-reviewed Continue diltiazem Continue valsartan 40 mg daily Increase physical activity as tolerated   Palpitations-none noted per patient.  Previously wore cardiac event monitor which showed no significant arrhythmias. Avoid triggers caffeine, chocolate, EtOH, dehydration etc. Continue diltiazem  Tobacco use-smoking less.  Half pack per day.  Smoking cessation strongly recommended. Smoking cessation information reviewed  Disposition: Follow-up with Dr. Antoine Poche or me in 12 months.   Thomasene Ripple. Sufyan Meidinger NP-C     11/07/2022, 11:13 AM [facility] [address] Office [phone] Fax [phone]    I spent 13 minutes examining this patient, reviewing medications, and using patient centered shared decision making involving her cardiac care.  Prior to her visit I spent greater than 20 minutes reviewing her past medical history,  medications, and prior cardiac tests.

## 2022-11-07 ENCOUNTER — Ambulatory Visit: Payer: 59 | Attending: General Practice | Admitting: General Practice

## 2022-11-07 ENCOUNTER — Encounter: Payer: Self-pay | Admitting: General Practice

## 2022-11-07 VITALS — BP 134/82 | HR 70 | Ht 71.0 in | Wt 169.6 lb

## 2022-11-07 DIAGNOSIS — Z720 Tobacco use: Secondary | ICD-10-CM

## 2022-11-07 DIAGNOSIS — R002 Palpitations: Secondary | ICD-10-CM

## 2022-11-07 DIAGNOSIS — I10 Essential (primary) hypertension: Secondary | ICD-10-CM | POA: Diagnosis not present

## 2022-11-07 MED ORDER — VALSARTAN 40 MG PO TABS: 40.0000 mg | ORAL_TABLET | Freq: Every day | ORAL | 3 refills | Status: DC

## 2022-11-07 NOTE — Patient Instructions (Signed)
Medication Instructions:  The current medical regimen is effective;  continue present plan and medications as directed. Please refer to the Current Medication list given to you today. *If you need a refill on your cardiac medications before your next appointment, please call your pharmacy*  Lab Work: NONE If you have labs (blood work) drawn today and your tests are completely normal, you will receive your results only by:  MyChart Message (if you have MyChart) OR  A paper copy in the mail If you have any lab test that is abnormal or we need to change your treatment, we will call you to review the results.  Other Instructions CONTINUE CURRENT PHYSICAL ACTIVITY PLEASE READ AND FOLLOW ATTACHED  SALTY 6 TAKE AND LOG YOUR BLOOD PRESSURE AT LEASE A COUPLE TIMES MONTHLY  Follow-Up: At [facility], you and your health needs are our priority.  As part of our continuing mission to provide you with exceptional heart care, we have created designated Provider Care Teams.  These Care Teams include your primary Cardiologist (physician) and Advanced Practice Providers (APPs -  Physician Assistants and Nurse Practitioners) who all work together to provide you with the care you need, when you need it.  Your next appointment:   12 month(s)  Provider:   Rollene Rotunda, MD  or Edd Fabian, FNP

## 2022-11-28 ENCOUNTER — Other Ambulatory Visit: Payer: Self-pay

## 2022-11-28 MED ORDER — DILTIAZEM HCL ER COATED BEADS 180 MG PO CP24: 180.0000 mg | ORAL_CAPSULE | Freq: Every day | ORAL | 3 refills | Status: DC

## 2023-03-09 ENCOUNTER — Other Ambulatory Visit: Payer: Self-pay | Admitting: Medical Genetics

## 2023-04-18 ENCOUNTER — Other Ambulatory Visit: Payer: Self-pay | Admitting: Internal Medicine

## 2023-04-18 ENCOUNTER — Other Ambulatory Visit: Payer: Self-pay | Admitting: Nurse Practitioner

## 2023-04-18 DIAGNOSIS — J209 Acute bronchitis, unspecified: Secondary | ICD-10-CM

## 2023-04-18 MED ORDER — ALBUTEROL SULFATE HFA 108 (90 BASE) MCG/ACT IN AERS: 2.0000 | INHALATION_SPRAY | Freq: Four times a day (QID) | RESPIRATORY_TRACT | 0 refills | Status: DC | PRN

## 2023-04-20 ENCOUNTER — Encounter: Payer: Self-pay | Admitting: Internal Medicine

## 2023-04-21 ENCOUNTER — Other Ambulatory Visit: Payer: Self-pay

## 2023-04-21 MED ORDER — BREZTRI AEROSPHERE 160-9-4.8 MCG/ACT IN AERO: 2.0000 | INHALATION_SPRAY | Freq: Two times a day (BID) | RESPIRATORY_TRACT | 0 refills | Status: DC

## 2023-06-05 NOTE — Patient Instructions (Incomplete)
 Consider getting a shingles vaccine and pneumonia vaccine.    Consider lung cancer screening with a Ct scan yearly.     Blood work was ordered.       Medications changes include :   singular 10 mg at night.  Start taking omeprazole daily.      A referral was ordered Dr Leone Payor and someone will call you to schedule an appointment.     Return in about 1 year (around 06/05/2024) for Physical Exam.   Health Maintenance, Male Adopting a healthy lifestyle and getting preventive care are important in promoting health and wellness. Ask your health care provider about: The right schedule for you to have regular tests and exams. Things you can do on your own to prevent diseases and keep yourself healthy. What should I know about diet, weight, and exercise? Eat a healthy diet  Eat a diet that includes plenty of vegetables, fruits, low-fat dairy products, and lean protein. Do not eat a lot of foods that are high in solid fats, added sugars, or sodium. Maintain a healthy weight Body mass index (BMI) is a measurement that can be used to identify possible weight problems. It estimates body fat based on height and weight. Your health care provider can help determine your BMI and help you achieve or maintain a healthy weight. Get regular exercise Get regular exercise. This is one of the most important things you can do for your health. Most adults should: Exercise for at least 150 minutes each week. The exercise should increase your heart rate and make you sweat (moderate-intensity exercise). Do strengthening exercises at least twice a week. This is in addition to the moderate-intensity exercise. Spend less time sitting. Even light physical activity can be beneficial. Watch cholesterol and blood lipids Have your blood tested for lipids and cholesterol at 63 years of age, then have this test every 5 years. You may need to have your cholesterol levels checked more often if: Your lipid or  cholesterol levels are high. You are older than 63 years of age. You are at high risk for heart disease. What should I know about cancer screening? Many types of cancers can be detected early and may often be prevented. Depending on your health history and family history, you may need to have cancer screening at various ages. This may include screening for: Colorectal cancer. Prostate cancer. Skin cancer. Lung cancer. What should I know about heart disease, diabetes, and high blood pressure? Blood pressure and heart disease High blood pressure causes heart disease and increases the risk of stroke. This is more likely to develop in people who have high blood pressure readings or are overweight. Talk with your health care provider about your target blood pressure readings. Have your blood pressure checked: Every 3-5 years if you are 62-4 years of age. Every year if you are 63 years old or older. If you are between the ages of 20 and 36 and are a current or former smoker, ask your health care provider if you should have a one-time screening for abdominal aortic aneurysm (AAA). Diabetes Have regular diabetes screenings. This checks your fasting blood sugar level. Have the screening done: Once every three years after age 27 if you are at a normal weight and have a low risk for diabetes. More often and at a younger age if you are overweight or have a high risk for diabetes. What should I know about preventing infection? Hepatitis B If you have a higher risk for  hepatitis B, you should be screened for this virus. Talk with your health care provider to find out if you are at risk for hepatitis B infection. Hepatitis C Blood testing is recommended for: Everyone born from [birth year] through 1965. Anyone with known risk factors for hepatitis C. Sexually transmitted infections (STIs) You should be screened each year for STIs, including gonorrhea and chlamydia, if: You are sexually active and are younger  than 63 years of age. You are older than 63 years of age and your health care provider tells you that you are at risk for this type of infection. Your sexual activity has changed since you were last screened, and you are at increased risk for chlamydia or gonorrhea. Ask your health care provider if you are at risk. Ask your health care provider about whether you are at high risk for HIV. Your health care provider may recommend a prescription medicine to help prevent HIV infection. If you choose to take medicine to prevent HIV, you should first get tested for HIV. You should then be tested every 3 months for as long as you are taking the medicine. Follow these instructions at home: Alcohol use Do not drink alcohol if your health care provider tells you not to drink. If you drink alcohol: Limit how much you have to 0-2 drinks a day. Know how much alcohol is in your drink. In the [place] one drink equals one 12 oz bottle of beer (355 mL), one 5 oz glass of wine (148 mL), or one 1 oz glass of hard liquor (44 mL). Lifestyle Do not use any products that contain nicotine or tobacco. These products include cigarettes, chewing tobacco, and vaping devices, such as e-cigarettes. If you need help quitting, ask your health care provider. Do not use street drugs. Do not share needles. Ask your health care provider for help if you need support or information about quitting drugs. General instructions Schedule regular health, dental, and eye exams. Stay current with your vaccines. Tell your health care provider if: You often feel depressed. You have ever been abused or do not feel safe at home. Summary Adopting a healthy lifestyle and getting preventive care are important in promoting health and wellness. Follow your health care provider's instructions about healthy diet, exercising, and getting tested or screened for diseases. Follow your health care provider's instructions on monitoring your cholesterol and  blood pressure. This information is not intended to replace advice given to you by your health care provider. Make sure you discuss any questions you have with your health care provider. Document Revised: 07/06/2020 Document Reviewed: 07/06/2020 Elsevier Patient Education  2024 [employer].

## 2023-06-05 NOTE — Progress Notes (Unsigned)
 Subjective:    Patient ID: Roberto Jenkins, male    DOB: [birth year], 63 y.o.   MRN: [id number]      HPI Roberto Jenkins is here for a Physical exam and her chronic medical problems.   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  Not getting in as much money so there are more financial constraints.  His fiance has been having more medical issues and he is helping to pay for medications.  Chronic cough seems to have gotten worse and more persistent.   Medications and allergies reviewed with patient and updated if appropriate.  Current Outpatient Medications on File Prior to Visit  Medication Sig Dispense Refill   albuterol (VENTOLIN HFA) 108 (90 Base) MCG/ACT inhaler Inhale 2 puffs into the lungs every 6 (six) hours as needed for wheezing or shortness of breath. 8 g 0   Budeson-Glycopyrrol-Formoterol (BREZTRI AEROSPHERE) 160-9-4.8 MCG/ACT AERO Inhale 2 puffs into the lungs 2 (two) times daily. 5.9 g 0   cetirizine (ZYRTEC) 10 MG tablet Take 10 mg by mouth at bedtime.     diltiazem (CARDIZEM CD) 180 MG 24 hr capsule Take 1 capsule (180 mg total) by mouth daily. 90 capsule 3   triamcinolone (NASACORT) 55 MCG/ACT AERO nasal inhaler Place 2 sprays into the nose daily. 1 Inhaler 6   valACYclovir (VALTREX) 1000 MG tablet Take 2 tablets (2,000 mg total) by mouth 2 (two) times daily. Use for one day for outbreak 20 tablet 5   valsartan (DIOVAN) 40 MG tablet Take 1 tablet (40 mg total) by mouth daily. 90 tablet 3   No current facility-administered medications on file prior to visit.    Review of Systems  Constitutional:  Negative for fever.  HENT:  Positive for congestion, sore throat and tinnitus. Negative for ear pain (clogged ears).   Eyes:  Negative for visual disturbance.  Respiratory:  Positive for cough (mostly productive), shortness of breath and wheezing (daily).   Cardiovascular:  Positive for chest pain (with coughing) and palpitations. Negative for leg  swelling.  Gastrointestinal:  Positive for anal bleeding (recent - hemorrhoid -- due for colonoscopy). Negative for abdominal pain, blood in stool, constipation and diarrhea.       Increased gerd - regularly - taking tums  Genitourinary:  Negative for difficulty urinating and dysuria.  Musculoskeletal:  Positive for arthralgias (hands) and back pain (intermittent).  Skin:  Negative for rash.  Neurological:  Positive for headaches (occ).  Psychiatric/Behavioral:  Positive for dysphoric mood. The patient is nervous/anxious.        Objective:   Vitals:   06/06/23 1405  BP: 126/78  Pulse: 79  Temp: 98.4 F (36.9 C)  SpO2: 99%   Filed Weights   06/06/23 1405  Weight: 178 lb (80.7 kg)   Body mass index is 24.83 kg/m.  BP Readings from Last 3 Encounters:  06/06/23 126/78  11/07/22 134/82  09/08/22 138/78    Wt Readings from Last 3 Encounters:  06/06/23 178 lb (80.7 kg)  11/07/22 169 lb 9.6 oz (76.9 kg)  09/08/22 161 lb 12.8 oz (73.4 kg)       Physical Exam Constitutional: She appears well-developed and well-nourished. No distress.  HENT:  Head: Normocephalic and atraumatic.  Right Ear: External ear normal. Normal ear canal and TM Left Ear: External ear normal.  Normal ear canal and TM Mouth/Throat: Oropharynx is clear and moist.  Eyes: Conjunctivae normal.  Neck: Neck supple. No tracheal deviation present.  No thyromegaly present.  No carotid bruit  Cardiovascular: Normal rate, regular rhythm and normal heart sounds.   No murmur heard.  No edema. Pulmonary/Chest: Effort normal and breath sounds normal. No respiratory distress. She has no wheezes. She has no rales.  Breast: deferred   Abdominal: Soft. She exhibits no distension. There is no tenderness.  Lymphadenopathy: She has no cervical adenopathy.  Skin: Skin is warm and dry. She is not diaphoretic.  Psychiatric: She has a normal mood and affect. Her behavior is normal.     Lab Results  Component Value Date    WBC 6.6 09/15/2022   HGB 14.9 09/15/2022   HCT 41.7 09/15/2022   PLT 247 09/15/2022   GLUCOSE 98 09/15/2022   CHOL 217 (H) 10/22/2021   TRIG 61.0 10/22/2021   HDL 124.40 10/22/2021   LDLCALC 81 10/22/2021   ALT 62 (H) 11/07/2021   AST 72 (H) 11/07/2021   NA 139 09/15/2022   K 4.6 09/15/2022   CL 97 09/15/2022   CREATININE 0.74 (L) 09/15/2022   BUN 7 (L) 09/15/2022   CO2 26 09/15/2022   TSH 2.56 10/22/2021   PSA 0.49 10/22/2021   INR 0.9 02/01/2018   HGBA1C 4.6 02/08/2017         Assessment & Plan:   Physical exam: Screening blood work  ordered Exercise   - very active job - walks > 2 miles Weight  normal Substance abuse  he smokes occasionally  Discussed annual CT screening test for lung cancer  Referral ordered for GI for colonoscopy  Reviewed recommended immunizations.   Health Maintenance  Topic Date Due   Lung Cancer Screening  07/07/2020   Colonoscopy  11/23/2021   COVID-19 Vaccine (4 - 2024-25 season) 06/22/2023 (Originally 10/30/2022)   Zoster Vaccines- Shingrix (1 of 2) 09/05/2023 (Originally 03/12/2010)   Pneumococcal Vaccine 20-48 Years old (1 of 2 - PCV) 06/05/2024 (Originally 03/12/1966)   INFLUENZA VACCINE  09/29/2023   DTaP/Tdap/Td (2 - Td or Tdap) 02/09/2027   Hepatitis C Screening  Completed   HIV Screening  Completed   HPV VACCINES  Aged Out          See Problem List for Assessment and Plan of chronic medical problems.

## 2023-06-06 ENCOUNTER — Ambulatory Visit (INDEPENDENT_AMBULATORY_CARE_PROVIDER_SITE_OTHER): Admitting: Internal Medicine

## 2023-06-06 ENCOUNTER — Encounter: Payer: Self-pay | Admitting: Internal Medicine

## 2023-06-06 VITALS — BP 126/78 | HR 79 | Temp 98.4°F | Ht 71.0 in | Wt 178.0 lb

## 2023-06-06 DIAGNOSIS — Z860101 Personal history of adenomatous and serrated colon polyps: Secondary | ICD-10-CM

## 2023-06-06 DIAGNOSIS — J3089 Other allergic rhinitis: Secondary | ICD-10-CM

## 2023-06-06 DIAGNOSIS — M720 Palmar fascial fibromatosis [Dupuytren]: Secondary | ICD-10-CM | POA: Insufficient documentation

## 2023-06-06 DIAGNOSIS — J209 Acute bronchitis, unspecified: Secondary | ICD-10-CM

## 2023-06-06 DIAGNOSIS — I10 Essential (primary) hypertension: Secondary | ICD-10-CM

## 2023-06-06 DIAGNOSIS — K760 Fatty (change of) liver, not elsewhere classified: Secondary | ICD-10-CM | POA: Diagnosis not present

## 2023-06-06 DIAGNOSIS — J449 Chronic obstructive pulmonary disease, unspecified: Secondary | ICD-10-CM

## 2023-06-06 DIAGNOSIS — Z125 Encounter for screening for malignant neoplasm of prostate: Secondary | ICD-10-CM

## 2023-06-06 DIAGNOSIS — Z0000 Encounter for general adult medical examination without abnormal findings: Secondary | ICD-10-CM | POA: Diagnosis not present

## 2023-06-06 DIAGNOSIS — J4530 Mild persistent asthma, uncomplicated: Secondary | ICD-10-CM

## 2023-06-06 DIAGNOSIS — K219 Gastro-esophageal reflux disease without esophagitis: Secondary | ICD-10-CM | POA: Insufficient documentation

## 2023-06-06 DIAGNOSIS — B009 Herpesviral infection, unspecified: Secondary | ICD-10-CM

## 2023-06-06 DIAGNOSIS — J440 Chronic obstructive pulmonary disease with acute lower respiratory infection: Secondary | ICD-10-CM

## 2023-06-06 LAB — TSH: TSH: 2.21 u[IU]/mL (ref 0.35–5.50)

## 2023-06-06 LAB — CBC WITH DIFFERENTIAL/PLATELET
Basophils Absolute: 0.1 10*3/uL (ref 0.0–0.1)
Basophils Relative: 0.9 % (ref 0.0–3.0)
Eosinophils Absolute: 0.1 10*3/uL (ref 0.0–0.7)
Eosinophils Relative: 2.1 % (ref 0.0–5.0)
HCT: 44.7 % (ref 39.0–52.0)
Hemoglobin: 14.9 g/dL (ref 13.0–17.0)
Lymphocytes Relative: 23.6 % (ref 12.0–46.0)
Lymphs Abs: 1.5 10*3/uL (ref 0.7–4.0)
MCHC: 33.4 g/dL (ref 30.0–36.0)
MCV: 93.3 fl (ref 78.0–100.0)
Monocytes Absolute: 0.6 10*3/uL (ref 0.1–1.0)
Monocytes Relative: 10 % (ref 3.0–12.0)
Neutro Abs: 4 10*3/uL (ref 1.4–7.7)
Neutrophils Relative %: 63.4 % (ref 43.0–77.0)
Platelets: 219 10*3/uL (ref 150.0–400.0)
RBC: 4.79 Mil/uL (ref 4.22–5.81)
RDW: 13.3 % (ref 11.5–15.5)
WBC: 6.3 10*3/uL (ref 4.0–10.5)

## 2023-06-06 LAB — COMPREHENSIVE METABOLIC PANEL WITH GFR
ALT: 10 U/L (ref 0–53)
AST: 12 U/L (ref 0–37)
Albumin: 4.7 g/dL (ref 3.5–5.2)
Alkaline Phosphatase: 50 U/L (ref 39–117)
BUN: 11 mg/dL (ref 6–23)
CO2: 29 meq/L (ref 19–32)
Calcium: 9.5 mg/dL (ref 8.4–10.5)
Chloride: 101 meq/L (ref 96–112)
Creatinine, Ser: 0.74 mg/dL (ref 0.40–1.50)
GFR: 96.62 mL/min (ref >60.00)
Glucose, Bld: 97 mg/dL (ref 70–99)
Potassium: 4.8 meq/L (ref 3.5–5.1)
Sodium: 139 meq/L (ref 135–145)
Total Bilirubin: 0.6 mg/dL (ref 0.2–1.2)
Total Protein: 6.8 g/dL (ref 6.0–8.3)

## 2023-06-06 LAB — LIPID PANEL
Cholesterol: 229 mg/dL — ABNORMAL HIGH (ref 0–200)
HDL: 115.9 mg/dL (ref >39.00)
LDL Cholesterol: 106 mg/dL — ABNORMAL HIGH (ref 0–99)
NonHDL: 113.3
Total CHOL/HDL Ratio: 2
Triglycerides: 38 mg/dL (ref 0.0–149.0)
VLDL: 7.6 mg/dL (ref 0.0–40.0)

## 2023-06-06 LAB — PSA, MEDICARE: PSA: 0.41 ng/mL (ref 0.10–4.00)

## 2023-06-06 MED ORDER — VALACYCLOVIR HCL 1 G PO TABS: 2000.0000 mg | ORAL_TABLET | Freq: Two times a day (BID) | ORAL | 5 refills | Status: AC

## 2023-06-06 MED ORDER — ALBUTEROL SULFATE HFA 108 (90 BASE) MCG/ACT IN AERS: 2.0000 | INHALATION_SPRAY | Freq: Four times a day (QID) | RESPIRATORY_TRACT | 11 refills | Status: AC | PRN

## 2023-06-06 MED ORDER — MONTELUKAST SODIUM 10 MG PO TABS: 10.0000 mg | ORAL_TABLET | Freq: Every day | ORAL | 3 refills | Status: DC

## 2023-06-06 MED ORDER — BREZTRI AEROSPHERE 160-9-4.8 MCG/ACT IN AERO: 2.0000 | INHALATION_SPRAY | Freq: Two times a day (BID) | RESPIRATORY_TRACT | 11 refills | Status: DC

## 2023-06-06 NOTE — Assessment & Plan Note (Signed)
 Chronic Year-round Not well-controlled Continue Zyrtec nightly, Nasacort Would like to retry Singulair 10 mg nightly-this did make him have weird thoughts and if that occurs again he will stop the medication Discussed seeing allergy for further evaluation-he has had allergy testing in the past and was on allergy injections in the past and that did not help

## 2023-06-06 NOTE — Assessment & Plan Note (Signed)
 Chronic Likely has combination of asthma and COPD Since COVID he has had a more persistent cough and is bringing up more phlegm-likely developing chronic bronchitis Stressed Smoking cessation completely - not smoking much Continue Breztri bid, albuterol prn Discussed seeing pulm

## 2023-06-06 NOTE — Assessment & Plan Note (Signed)
 Chronic Intermittent cold sores Valtrex 2000 mg every 12 hours x 1 day sent to pharmacy (as needed

## 2023-06-06 NOTE — Assessment & Plan Note (Signed)
 Chronic Having frequent GERD Start omeprazole 20 mg daily

## 2023-06-06 NOTE — Assessment & Plan Note (Signed)
 Chronic Blood pressure well controlled Cbc, cmp Continue diltiazem 180 mg daily, valsartan 40 mg daily

## 2023-06-06 NOTE — Assessment & Plan Note (Signed)
 Chronic Cmp Weight is normal Low fat/chol/sugars diet

## 2023-06-06 NOTE — Assessment & Plan Note (Addendum)
 Chronic Overdue for colonoscopy Refer to GI

## 2023-06-06 NOTE — Assessment & Plan Note (Signed)
 Chronic Mild, persistent Not currently controlled - likely related to allergies and possibly GERD Breztri twice daily, Albuterol prn Still smoking some-stressed complete cessation

## 2023-06-08 ENCOUNTER — Encounter: Payer: Self-pay | Admitting: Internal Medicine

## 2023-06-27 IMAGING — DX DG CHEST 2V
2 series · 2 of 2 positions shown · non-contrast
Comparison: Chest x-ray 07/15/2019.

CLINICAL DATA: Productive cough.

EXAM:
CHEST - 2 VIEW

[chest pa]
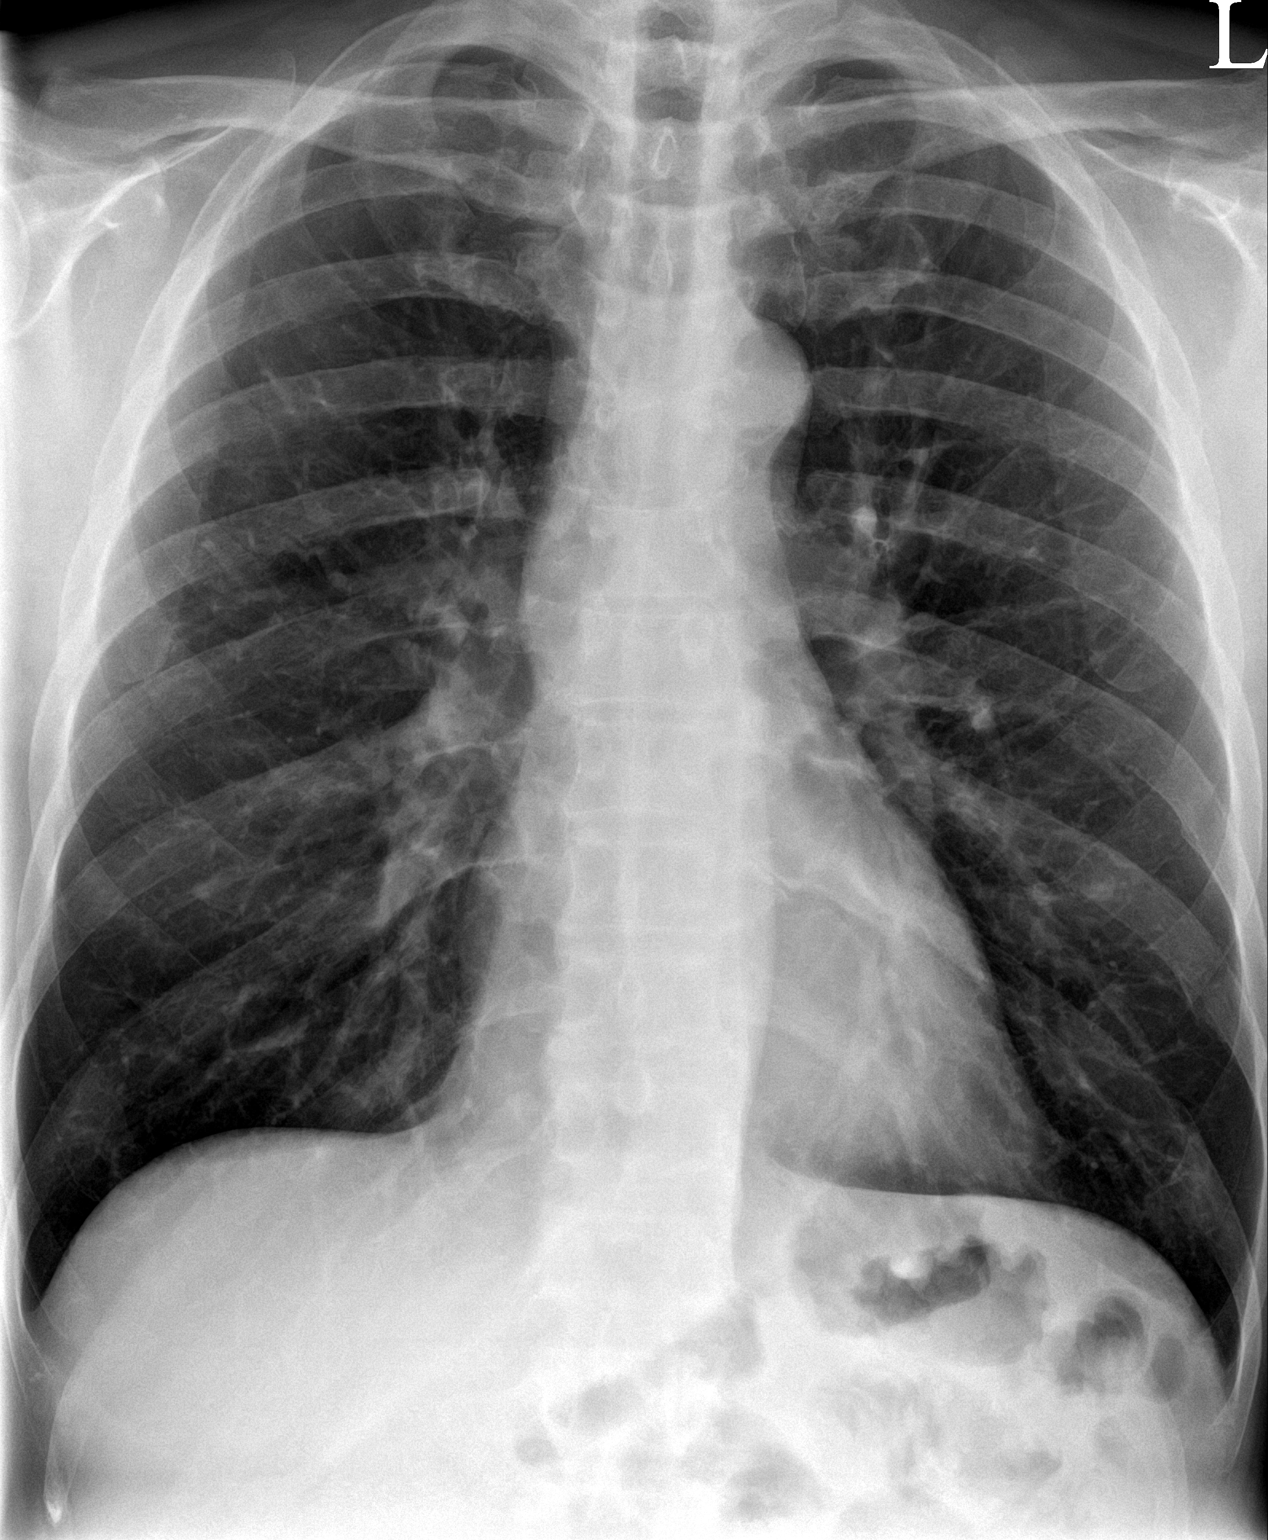

[chest lat]
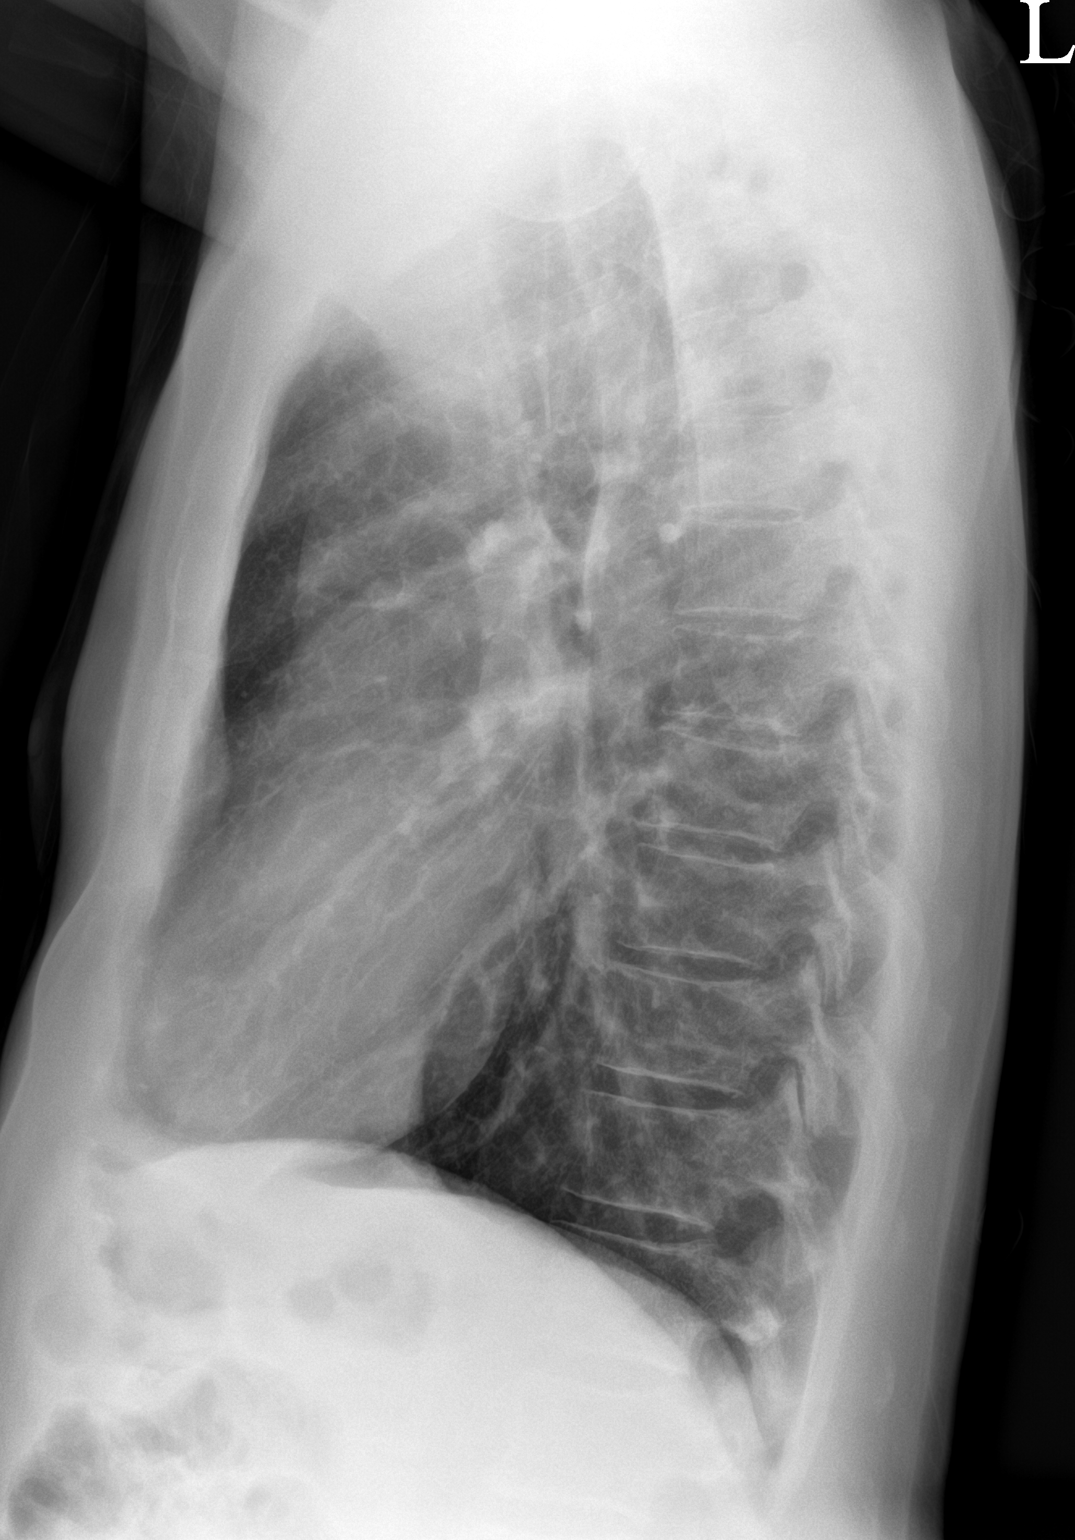

[2 of 2 positions shown; findings below may reference images not displayed]

FINDINGS: The heart size and mediastinal contours are within normal limits.
Both lungs are clear. The visualized skeletal structures are
unremarkable.
IMPRESSION: No active cardiopulmonary disease.

## 2023-08-10 ENCOUNTER — Encounter: Payer: Self-pay | Admitting: Internal Medicine

## 2023-08-30 ENCOUNTER — Other Ambulatory Visit: Payer: Self-pay | Admitting: Internal Medicine

## 2023-11-02 ENCOUNTER — Encounter: Payer: Self-pay | Admitting: Cardiology

## 2023-11-08 ENCOUNTER — Other Ambulatory Visit: Payer: Self-pay | Admitting: Cardiology

## 2023-11-10 ENCOUNTER — Other Ambulatory Visit: Payer: Self-pay | Admitting: Internal Medicine

## 2023-11-21 ENCOUNTER — Encounter: Payer: Self-pay | Admitting: Internal Medicine

## 2023-12-03 ENCOUNTER — Other Ambulatory Visit: Payer: Self-pay | Admitting: General Practice

## 2023-12-07 NOTE — Progress Notes (Unsigned)
  Cardiology Office Note:   Date:  12/07/2023  ID:  Roberto Jenkins, DOB [birth year], MRN [id number] PCP: Geofm Glade PARAS, MD  [facility] Providers Cardiologist:  Roberto Schilling, MD {  History of Present Illness:   Roberto Jenkins is a 63 y.o. male who presents for follow up of chest pain and palpitations.   Coronary calcium score was zero and he had no CAD on CT.    He had a negative POET (Plain Old Exercise Treadmill) in 2019.     Since I last saw him ***   *** I switched him to diltiazem  for his blood pressure control.  He has not had any further palpitations.  He wore a monitor that did not demonstrate any significant arrhythmia.  Tingling in his arms and now is starting to have a crick in his neck.  He has not had any new chest pressure or arm discomfort.  He has had no new palpitations, presyncope or syncope.  He has had no weight gain or edema.    ROS: ***  Studies Reviewed:    EKG:       ***  Risk Assessment/Calculations:   {Does this patient have ATRIAL FIBRILLATION?:[id number]} No BP recorded.  {Refresh Note OR Click here to enter BP  :1}***        Physical Exam:   VS:  There were no vitals taken for this visit.   Wt Readings from Last 3 Encounters:  06/06/23 178 lb (80.7 kg)  11/07/22 169 lb 9.6 oz (76.9 kg)  09/08/22 161 lb 12.8 oz (73.4 kg)     GEN: Well nourished, well developed in no acute distress NECK: No JVD; No carotid bruits CARDIAC: ***RR, *** murmurs, rubs, gallops RESPIRATORY:  Clear to auscultation without rales, wheezing or rhonchi  ABDOMEN: Soft, non-tender, non-distended EXTREMITIES:  No edema; No deformity   ASSESSMENT AND PLAN:   ARM PAIN:   ***  His arm tingling is not cardiac for the study and for the atypical nature of it.  I have suggested he have somebody consider whether this could be a cervical disc issue.     PALPITATIONS:  ***  He had no significant arrhythmias on monitor.  He will continue the Cardizem .    TOBACCO:   *** We  again talked about the need to stop smoking.    HTN: His blood pressure is *** well controlled.  Continue the meds as listed.        Follow up ***  Signed, Roberto Schilling, MD

## 2023-12-08 ENCOUNTER — Ambulatory Visit: Attending: Cardiology | Admitting: Cardiology

## 2023-12-08 ENCOUNTER — Encounter: Payer: Self-pay | Admitting: Cardiology

## 2023-12-08 VITALS — BP 162/84 | HR 78 | Ht 71.0 in | Wt 175.4 lb

## 2023-12-08 DIAGNOSIS — M79603 Pain in arm, unspecified: Secondary | ICD-10-CM | POA: Diagnosis not present

## 2023-12-08 DIAGNOSIS — I10 Essential (primary) hypertension: Secondary | ICD-10-CM

## 2023-12-08 DIAGNOSIS — R002 Palpitations: Secondary | ICD-10-CM | POA: Diagnosis not present

## 2023-12-08 NOTE — Patient Instructions (Signed)
 Medication Instructions:  Your physician recommends that you continue on your current medications as directed. Please refer to the Current Medication list given to you today.  *If you need a refill on your cardiac medications before your next appointment, please call your pharmacy*  Lab Work: None ordered If you have labs (blood work) drawn today and your tests are completely normal, you will receive your results only by: MyChart Message (if you have MyChart) OR A paper copy in the mail If you have any lab test that is abnormal or we need to change your treatment, we will call you to review the results.  Follow-Up: At [facility], you and your health needs are our priority.  As part of our continuing mission to provide you with exceptional heart care, our providers are all part of one team.  This team includes your primary Cardiologist (physician) and Advanced Practice Providers or APPs (Physician Assistants and Nurse Practitioners) who all work together to provide you with the care you need, when you need it.  Your next appointment:   1 year(s)  Provider:   Josefa Beauvais, NP

## 2023-12-12 ENCOUNTER — Other Ambulatory Visit (HOSPITAL_COMMUNITY): Payer: Self-pay

## 2023-12-12 ENCOUNTER — Ambulatory Visit

## 2023-12-12 VITALS — Ht 71.0 in | Wt 173.0 lb

## 2023-12-12 DIAGNOSIS — Z860100 Personal history of colon polyps, unspecified: Secondary | ICD-10-CM

## 2023-12-12 MED ORDER — NA SULFATE-K SULFATE-MG SULF 17.5-3.13-1.6 GM/177ML PO SOLN
1.0000 | Freq: Once | ORAL | 0 refills | Status: AC
  Filled 2023-12-12: qty 354, 2d supply, fill #0
  Filled 2023-12-25: qty 354, 1d supply, fill #0

## 2023-12-12 NOTE — Progress Notes (Signed)

## 2023-12-13 ENCOUNTER — Other Ambulatory Visit: Payer: Self-pay | Admitting: General Practice

## 2023-12-25 ENCOUNTER — Other Ambulatory Visit (HOSPITAL_COMMUNITY): Payer: Self-pay

## 2023-12-25 MED ORDER — NA SULFATE-K SULFATE-MG SULF 17.5-3.13-1.6 GM/177ML PO SOLN
1.0000 | ORAL | 0 refills | Status: DC
  Filled 2023-12-25: qty 1, 1d supply, fill #0

## 2023-12-25 NOTE — Progress Notes (Unsigned)
 [facility] History and Physical   Primary Care Physician:  Geofm Glade PARAS, MD   Reason for Procedure:    Encounter Diagnosis  Name Primary?   Hx of adenomatous polyp of colon Yes     Plan:    colonoscopy     HPI: Roberto Jenkins is a 63 y.o. male here for a surveillance colonoscopy s/p removal of a 2 mm adenoma removed 2016.   Past Medical History:  Diagnosis Date   Asthma    Cancer (HCC)    Skin cancer   COPD (chronic obstructive pulmonary disease) (HCC)    Hx of adenomatous polyp of colon 12/02/2014    Past Surgical History:  Procedure Laterality Date   COLONOSCOPY  2004   in [place];pt doesn't know MD name.   HERNIA REPAIR Bilateral    MOHS SURGERY Left    shin   NOSE SURGERY     x2     Current Outpatient Medications  Medication Sig Dispense Refill   budeson-glycopyrrolate-formoterol  (BREZTRI  AEROSPHERE) 160-9-4.8 MCG/ACT AERO inhaler Inhale 2 puffs into the lungs 2 (two) times daily. 5.9 g 11   cetirizine (ZYRTEC) 10 MG tablet Take 10 mg by mouth at bedtime.     diltiazem  (CARDIZEM  CD) 180 MG 24 hr capsule Take 1 capsule (180 mg total) by mouth daily. 30 capsule 0   montelukast  (SINGULAIR ) 10 MG tablet Take 1 tablet by mouth at bedtime. 30 tablet 1   triamcinolone  (NASACORT ) 55 MCG/ACT AERO nasal inhaler Place 2 sprays into the nose daily. 1 Inhaler 6   valACYclovir  (VALTREX ) 1000 MG tablet Take 2 tablets (2,000 mg total) by mouth 2 (two) times daily. Use for one day for outbreak 20 tablet 5   valsartan  (DIOVAN ) 40 MG tablet Take 1 tablet by mouth daily. 90 tablet 3   albuterol  (VENTOLIN  HFA) 108 (90 Base) MCG/ACT inhaler Inhale 2 puffs into the lungs every 6 (six) hours as needed for wheezing or shortness of breath. 8 g 11   doxycycline  (VIBRAMYCIN ) 100 MG capsule Take 100 mg by mouth daily.     fexofenadine (ALLEGRA) 180 MG tablet Take 180 mg by mouth daily.     Na Sulfate-K Sulfate-Mg Sulfate concentrate (SUPREP) 17.5-3.13-1.6 GM/177ML  SOLN Take 1 kit (354 mLs total) as directed. 1 kit 0   Current Facility-Administered Medications  Medication Dose Route Frequency Provider Last Rate Last Admin   0.9 %  sodium chloride  infusion  500 mL Intravenous Continuous Avram Lupita BRAVO, MD        Allergies as of 12/26/2023   (No Known Allergies)    Family History  Adopted: Yes  Family history unknown: Yes    Social History   Socioeconomic History   Marital status: Divorced    Spouse name: Not on file   Number of children: 1   Years of education: 14   Highest education level: GED or equivalent  Occupational History   Occupation: Leisure Centre Manager  Tobacco Use   Smoking status: Some Days    Current packs/day: 1.00    Average packs/day: 1 pack/day for 38.0 years (38.0 ttl pk-yrs)    Types: Cigarettes   Smokeless tobacco: Never  Vaping Use   Vaping status: Never Used  Substance and Sexual Activity   Alcohol use: Yes    Alcohol/week: 5.0 standard drinks of alcohol    Types: 5 Shots of liquor per week   Drug use: Yes    Types: Amphetamines, LSD, Other-see comments, Marijuana    Comment:  once every 3 months; occasionally, uses mushrooms.    Sexual activity: Not on file  Other Topics Concern   Not on file  Social History Narrative   Fun: Go to shows, occasionally workout.    Denies religious beliefs effecting health care.    Social Drivers of Health   Financial Resource Strain: Medium Risk (06/05/2023)   Overall Financial Resource Strain (CARDIA)    Difficulty of Paying Living Expenses: Somewhat hard  Food Insecurity: Food Insecurity Present (06/05/2023)   Hunger Vital Sign    Worried About Running Out of Food in the Last Year: Sometimes true    Ran Out of Food in the Last Year: Sometimes true  Transportation Needs: No Transportation Needs (06/05/2023)   PRAPARE - Administrator, Civil Service (Medical): No    Lack of Transportation (Non-Medical): No  Physical Activity: Sufficiently Active (06/05/2023)   Exercise  Vital Sign    Days of Exercise per Week: 4 days    Minutes of Exercise per Session: 60 min  Stress: Stress Concern Present (06/05/2023)   [employer] of Occupational Health - Occupational Stress Questionnaire    Feeling of Stress : To some extent  Social Connections: Moderately Isolated (06/05/2023)   Social Connection and Isolation Panel    Frequency of Communication with Friends and Family: More than three times a week    Frequency of Social Gatherings with Friends and Family: More than three times a week    Attends Religious Services: Never    Database Administrator or Organizations: No    Attends Engineer, Structural: Not on file    Marital Status: Living with partner  Intimate Partner Violence: Not on file    Review of Systems:  All other review of systems negative except as mentioned in the HPI.  Physical Exam: Vital signs BP (!) 158/95   Pulse 74   Temp 97.6 F (36.4 C)   Ht 5' 11 (1.803 m)   Wt 173 lb (78.5 kg)   SpO2 98%   BMI 24.13 kg/m   General:   Alert,  Well-developed, well-nourished, pleasant and cooperative in NAD Lungs:  Clear throughout to auscultation.   Heart:  Regular rate and rhythm; no murmurs, clicks, rubs,  or gallops. Abdomen:  Soft, nontender and nondistended. Normal bowel sounds.   Neuro/Psych:  Alert and cooperative. Normal mood and affect. A and O x 3   @Oshua Mcconaha  CHARLENA [name], MD, [facility] [phone] (pager) 12/26/2023 1:36 PM@

## 2023-12-26 ENCOUNTER — Other Ambulatory Visit (INDEPENDENT_AMBULATORY_CARE_PROVIDER_SITE_OTHER)

## 2023-12-26 ENCOUNTER — Ambulatory Visit (AMBULATORY_SURGERY_CENTER): Admitting: Internal Medicine

## 2023-12-26 ENCOUNTER — Other Ambulatory Visit: Payer: Self-pay | Admitting: Medical Genetics

## 2023-12-26 ENCOUNTER — Encounter: Payer: Self-pay | Admitting: Internal Medicine

## 2023-12-26 ENCOUNTER — Other Ambulatory Visit (HOSPITAL_COMMUNITY): Payer: Self-pay

## 2023-12-26 VITALS — BP 114/55 | HR 75 | Temp 97.6°F | Resp 14 | Ht 71.0 in | Wt 173.0 lb

## 2023-12-26 DIAGNOSIS — R140 Abdominal distension (gaseous): Secondary | ICD-10-CM

## 2023-12-26 DIAGNOSIS — Z1211 Encounter for screening for malignant neoplasm of colon: Secondary | ICD-10-CM | POA: Diagnosis present

## 2023-12-26 DIAGNOSIS — Z860101 Personal history of adenomatous and serrated colon polyps: Secondary | ICD-10-CM | POA: Diagnosis not present

## 2023-12-26 DIAGNOSIS — Z006 Encounter for examination for normal comparison and control in clinical research program: Secondary | ICD-10-CM

## 2023-12-26 DIAGNOSIS — N400 Enlarged prostate without lower urinary tract symptoms: Secondary | ICD-10-CM

## 2023-12-26 DIAGNOSIS — K648 Other hemorrhoids: Secondary | ICD-10-CM

## 2023-12-26 DIAGNOSIS — D120 Benign neoplasm of cecum: Secondary | ICD-10-CM

## 2023-12-26 DIAGNOSIS — R197 Diarrhea, unspecified: Secondary | ICD-10-CM

## 2023-12-26 MED ORDER — SODIUM CHLORIDE 0.9 % IV SOLN: 500.0000 mL | INTRAVENOUS | Status: AC

## 2023-12-26 NOTE — Progress Notes (Signed)
 Called to room to assist during endoscopic procedure.  Patient ID and intended procedure confirmed with present staff. Received instructions for my participation in the procedure from the performing physician.

## 2023-12-26 NOTE — Progress Notes (Signed)
 Pt's states no medical or surgical changes since previsit or office visit.

## 2023-12-26 NOTE — Patient Instructions (Addendum)
 I found and removed 3 polyps today.  I will let you know pathology results and when to have another routine colonoscopy by mail and/or My Chart.  Will test for gluten allergy or celiac disease with a blood test today to understand your bloating problems better.  Prostate is mildly enlarged - did not feel anything suspicious and I see that PSA was ok this year.    I appreciate the opportunity to care for you. Lupita CHARLENA [name], MD, FACG  YOU HAD AN ENDOSCOPIC PROCEDURE TODAY AT THE [facility]:   Refer to the procedure report that was given to you for any specific questions about what was found during the examination.  If the procedure report does not answer your questions, please call your gastroenterologist to clarify.  If you requested that your care partner not be given the details of your procedure findings, then the procedure report has been included in a sealed envelope for you to review at your convenience later.  YOU SHOULD EXPECT: Some feelings of bloating in the abdomen. Passage of more gas than usual.  Walking can help get rid of the air that was put into your GI tract during the procedure and reduce the bloating. If you had a lower endoscopy (such as a colonoscopy or flexible sigmoidoscopy) you may notice spotting of blood in your stool or on the toilet paper. If you underwent a bowel prep for your procedure, you may not have a normal bowel movement for a few days.  Please Note:  You might notice some irritation and congestion in your nose or some drainage.  This is from the oxygen used during your procedure.  There is no need for concern and it should clear up in a day or so.  SYMPTOMS TO REPORT IMMEDIATELY:  Following lower endoscopy (colonoscopy or flexible sigmoidoscopy):  Excessive amounts of blood in the stool  Significant tenderness or worsening of abdominal pains  Swelling of the abdomen that is new, acute  Fever of 100F or higher  For urgent or emergent  issues, a gastroenterologist can be reached at any hour by calling [phone]. Do not use MyChart messaging for urgent concerns.    DIET:  We do recommend a small meal at first, but then you may proceed to your regular diet.  Drink plenty of fluids but you should avoid alcoholic beverages for 24 hours.  MEDICATIONS: Continue present medications.  FOLLOW UP: Await pathology results. Repeat colonoscopy is recommended. The colonoscopy date will be determined after pathology results from today's exam become available for review. Dr. [name] has ordered tissue transglutainase and IgA level today to evaluate post-prandial bloating and episodic diarrhea symptoms.   Educational handouts given to patient: Polyps, Hemorrhoids.  Thank you for allowing us  to provide for your healthcare needs today.  ACTIVITY:  You should plan to take it easy for the rest of today and you should NOT DRIVE or use heavy machinery until tomorrow (because of the sedation medicines used during the test).    FOLLOW UP: Our staff will call the number listed on your records the next business day following your procedure.  We will call around 7:15- 8:00 am to check on you and address any questions or concerns that you may have regarding the information given to you following your procedure. If we do not reach you, we will leave a message.     If any biopsies were taken you will be contacted by phone or by letter within the next  1-3 weeks.  Please call us  at [phone] if you have not heard about the biopsies in 3 weeks.    SIGNATURES/CONFIDENTIALITY: You and/or your care partner have signed paperwork which will be entered into your electronic medical record.  These signatures attest to the fact that that the information above on your After Visit Summary has been reviewed and is understood.  Full responsibility of the confidentiality of this discharge information lies with you and/or your care-partner.

## 2023-12-26 NOTE — Progress Notes (Signed)
 Sedate, gd SR, tolerated procedure well, VSS, report to RN

## 2023-12-26 NOTE — Op Note (Signed)
 [facility] Patient Name: Roberto Jenkins Procedure Date: 12/26/2023 1:34 PM MRN: [id number] Endoscopist: Lupita FORBES [name] , MD, [id number] Age: 63 Referring MD:  Date of Birth: 1960-11-06 Gender: Male Account #: [id number] Procedure:                Colonoscopy Indications:              High risk colon cancer surveillance: Personal                            history of colonic polyps, Last colonoscopy: 2016 Medicines:                Monitored Anesthesia Care Procedure:                Pre-Anesthesia Assessment:                           - Prior to the procedure, a History and Physical                            was performed, and patient medications and                            allergies were reviewed. The patient's tolerance of                            previous anesthesia was also reviewed. The risks                            and benefits of the procedure and the sedation                            options and risks were discussed with the patient.                            All questions were answered, and informed consent                            was obtained. Prior Anticoagulants: The patient has                            taken no anticoagulant or antiplatelet agents. ASA                            Grade Assessment: III - A patient with severe                            systemic disease. After reviewing the risks and                            benefits, the patient was deemed in satisfactory                            condition to undergo the procedure.  After obtaining informed consent, the colonoscope                            was passed under direct vision. Throughout the                            procedure, the patient's blood pressure, pulse, and                            oxygen saturations were monitored continuously. The                            CF [id number] #[id number] was introduced through the anus                            and  advanced to the the cecum, identified by                            appendiceal orifice and ileocecal valve. The                            colonoscopy was performed without difficulty. The                            patient tolerated the procedure well. The quality                            of the bowel preparation was good. The ileocecal                            valve, appendiceal orifice, and rectum were                            photographed. The bowel preparation used was SUPREP                            via split dose instruction. Scope In: 1:55:15 PM Scope Out: 2:19:36 PM Scope Withdrawal Time: 0 hours 21 minutes 53 seconds  Total Procedure Duration: 0 hours 24 minutes 21 seconds  Findings:                 The digital rectal exam findings include enlarged                            prostate. Pertinent negatives include no palpable                            rectal lesions.                           A 25 mm polyp was found in the cecum. The polyp was                            sessile. The polyp was removed with a saline  injection-lift technique using a cold snare                            piecemeal using endoscopic mucosal resection                            technique.SABRA Resection and retrieval were complete.                            Soft-tip coag ablation thermal therapy applied to                            edges. Verification of patient identification for                            the specimen was done. Estimated blood loss was                            minimal.                           Two sessile polyps were found in the cecum. The                            polyps were diminutive in size. These polyps were                            removed with a cold snare. Resection and retrieval                            were complete. Verification of patient                            identification for the specimen was done. Estimated                             blood loss was minimal.                           Internal hemorrhoids were found.                           The exam was otherwise without abnormality on                            direct and retroflexion views. Complications:            No immediate complications. Estimated Blood Loss:     Estimated blood loss was minimal. Impression:               - Enlarged prostate found on digital rectal exam.                            No nodules.                           -  One 25 mm polyp in the cecum, removed using                            injection-lift and a cold snare via endoscopic                            mucosal resection technique. Resected and                            retrieved. Edges of polypectomy site ablated with                            soft tip coagulation.                           - Two diminutive polyps in the cecum, removed with                            a cold snare. Resected and retrieved.                           - Internal hemorrhoids.                           - The examination was otherwise normal on direct                            and retroflexion views. Recommendation:           - Patient has a contact number available for                            emergencies. The signs and symptoms of potential                            delayed complications were discussed with the                            patient. Return to normal activities tomorrow.                            Written discharge instructions were provided to the                            patient.                           - Resume previous diet.                           - Continue present medications.                           - Await pathology results.                           - Repeat colonoscopy  is recommended. The                            colonoscopy date will be determined after pathology                            results from today's exam become available for                             review.                           - I have ordered tissue transglutainase and IgA                            level today to evaluate post-prandial bloating and                            episodic diarrhea symptoms. Lupita FORBES [name], MD 12/26/2023 2:34:26 PM This report has been signed electronically.

## 2023-12-27 ENCOUNTER — Telehealth: Payer: Self-pay | Admitting: *Deleted

## 2023-12-27 ENCOUNTER — Ambulatory Visit: Payer: Self-pay | Admitting: Internal Medicine

## 2023-12-27 DIAGNOSIS — Z860101 Personal history of adenomatous and serrated colon polyps: Secondary | ICD-10-CM

## 2023-12-27 LAB — IGA: Immunoglobulin A: 149 mg/dL (ref 70–320)

## 2023-12-27 LAB — TISSUE TRANSGLUTAMINASE, IGA: (tTG) Ab, IgA: <1 U/mL

## 2023-12-27 NOTE — Telephone Encounter (Signed)
  Follow up Call-     12/26/2023   12:57 PM  Call back number  Post procedure Call Back phone  # [phone]  Permission to leave phone message Yes   Left message to call back if any questions or concerns

## 2023-12-29 LAB — SURGICAL PATHOLOGY

## 2024-01-06 ENCOUNTER — Other Ambulatory Visit: Payer: Self-pay | Admitting: Internal Medicine

## 2024-01-09 ENCOUNTER — Other Ambulatory Visit: Payer: Self-pay | Admitting: Cardiology

## 2024-01-11 MED ORDER — DILTIAZEM HCL ER COATED BEADS 180 MG PO CP24: 180.0000 mg | ORAL_CAPSULE | Freq: Every day | ORAL | 3 refills | Status: AC

## 2024-02-02 ENCOUNTER — Other Ambulatory Visit: Payer: Self-pay | Admitting: Internal Medicine

## 2024-03-04 ENCOUNTER — Other Ambulatory Visit: Payer: Self-pay | Admitting: Internal Medicine

## 2024-04-02 ENCOUNTER — Other Ambulatory Visit: Payer: Self-pay | Admitting: Internal Medicine
